# Patient Record
Sex: Female | Born: 1940
Health system: Southern US, Community
[De-identification: ages and names within clinical notes are randomized; demographics above are authoritative.]

## PROBLEM LIST (undated history)

## (undated) DIAGNOSIS — I714 Abdominal aortic aneurysm, without rupture, unspecified: Secondary | ICD-10-CM

## (undated) DIAGNOSIS — R51 Headache: Secondary | ICD-10-CM

## (undated) DIAGNOSIS — R0600 Dyspnea, unspecified: Secondary | ICD-10-CM

## (undated) DIAGNOSIS — J449 Chronic obstructive pulmonary disease, unspecified: Secondary | ICD-10-CM

## (undated) DIAGNOSIS — Z972 Presence of dental prosthetic device (complete) (partial): Secondary | ICD-10-CM

## (undated) DIAGNOSIS — N1832 Chronic kidney disease, stage 3b: Secondary | ICD-10-CM

## (undated) DIAGNOSIS — I10 Essential (primary) hypertension: Secondary | ICD-10-CM

## (undated) DIAGNOSIS — I219 Acute myocardial infarction, unspecified: Secondary | ICD-10-CM

## (undated) DIAGNOSIS — Z95828 Presence of other vascular implants and grafts: Secondary | ICD-10-CM

## (undated) DIAGNOSIS — M1990 Unspecified osteoarthritis, unspecified site: Secondary | ICD-10-CM

## (undated) DIAGNOSIS — K219 Gastro-esophageal reflux disease without esophagitis: Secondary | ICD-10-CM

## (undated) DIAGNOSIS — N183 Chronic kidney disease, stage 3 (moderate): Secondary | ICD-10-CM

## (undated) DIAGNOSIS — R519 Headache, unspecified: Secondary | ICD-10-CM

## (undated) DIAGNOSIS — F419 Anxiety disorder, unspecified: Secondary | ICD-10-CM

## (undated) HISTORY — PX: ABDOMINAL HYSTERECTOMY: SHX81

## (undated) HISTORY — DX: Abdominal aortic aneurysm, without rupture, unspecified: I71.40

## (undated) HISTORY — DX: Abdominal aortic aneurysm, without rupture: I71.4

---

## 2007-01-01 ENCOUNTER — Ambulatory Visit: Payer: Self-pay | Admitting: Internal Medicine

## 2008-02-12 ENCOUNTER — Other Ambulatory Visit: Payer: Self-pay

## 2008-02-12 ENCOUNTER — Inpatient Hospital Stay: Payer: Self-pay | Admitting: Internal Medicine

## 2008-03-31 ENCOUNTER — Ambulatory Visit: Payer: Self-pay | Admitting: Family Medicine

## 2009-03-07 ENCOUNTER — Ambulatory Visit: Payer: Self-pay | Admitting: Surgery

## 2009-03-19 ENCOUNTER — Ambulatory Visit: Payer: Self-pay | Admitting: Internal Medicine

## 2009-05-01 ENCOUNTER — Inpatient Hospital Stay: Payer: Self-pay | Admitting: Internal Medicine

## 2009-05-16 ENCOUNTER — Ambulatory Visit: Payer: Self-pay | Admitting: Surgery

## 2009-05-18 ENCOUNTER — Ambulatory Visit: Payer: Self-pay | Admitting: Surgery

## 2009-08-25 HISTORY — PX: CARDIAC CATHETERIZATION: SHX172

## 2009-11-21 ENCOUNTER — Emergency Department: Payer: Self-pay | Admitting: Emergency Medicine

## 2010-01-23 DIAGNOSIS — Z95828 Presence of other vascular implants and grafts: Secondary | ICD-10-CM

## 2010-01-23 HISTORY — DX: Presence of other vascular implants and grafts: Z95.828

## 2010-01-24 ENCOUNTER — Ambulatory Visit: Payer: Self-pay | Admitting: Family Medicine

## 2010-02-11 ENCOUNTER — Inpatient Hospital Stay: Payer: Self-pay | Admitting: Cardiovascular Disease

## 2010-04-04 ENCOUNTER — Inpatient Hospital Stay: Payer: Self-pay | Admitting: Internal Medicine

## 2010-04-19 ENCOUNTER — Ambulatory Visit: Payer: Self-pay | Admitting: Vascular Surgery

## 2011-05-08 ENCOUNTER — Emergency Department: Payer: Self-pay | Admitting: Emergency Medicine

## 2011-11-03 ENCOUNTER — Ambulatory Visit: Payer: Self-pay | Admitting: Internal Medicine

## 2011-11-03 LAB — CBC WITH DIFFERENTIAL/PLATELET
Basophil #: 0.1 10*3/uL (ref 0.0–0.1)
Basophil %: 0.7 %
Eosinophil #: 0.4 10*3/uL (ref 0.0–0.7)
Eosinophil %: 5.4 %
HCT: 40.3 % (ref 35.0–47.0)
HGB: 13.2 g/dL (ref 12.0–16.0)
Lymphocyte #: 3.2 10*3/uL (ref 1.0–3.6)
Lymphocyte %: 41.1 %
MCH: 31.1 pg (ref 26.0–34.0)
MCHC: 32.8 g/dL (ref 32.0–36.0)
MCV: 95 fL (ref 80–100)
Monocyte #: 0.5 10*3/uL (ref 0.0–0.7)
Monocyte %: 6.8 %
Neutrophil #: 3.6 10*3/uL (ref 1.4–6.5)
Neutrophil %: 46 %
Platelet: 318 10*3/uL (ref 150–440)
RBC: 4.25 10*6/uL (ref 3.80–5.20)
RDW: 14.6 % — ABNORMAL HIGH (ref 11.5–14.5)
WBC: 7.9 10*3/uL (ref 3.6–11.0)

## 2011-11-03 LAB — COMPREHENSIVE METABOLIC PANEL
Albumin: 4.7 g/dL (ref 3.4–5.0)
Alkaline Phosphatase: 102 U/L (ref 50–136)
Anion Gap: 9 (ref 7–16)
BUN: 31 mg/dL — ABNORMAL HIGH (ref 7–18)
Bilirubin,Total: 0.3 mg/dL (ref 0.2–1.0)
Calcium, Total: 9.7 mg/dL (ref 8.5–10.1)
Chloride: 104 mmol/L (ref 98–107)
Co2: 29 mmol/L (ref 21–32)
Creatinine: 1.99 mg/dL — ABNORMAL HIGH (ref 0.60–1.30)
EGFR (African American): 32 — ABNORMAL LOW
EGFR (Non-African Amer.): 26 — ABNORMAL LOW
Glucose: 86 mg/dL (ref 65–99)
Osmolality: 289 (ref 275–301)
Potassium: 5.1 mmol/L (ref 3.5–5.1)
SGOT(AST): 38 U/L — ABNORMAL HIGH (ref 15–37)
SGPT (ALT): 23 U/L
Sodium: 142 mmol/L (ref 136–145)
Total Protein: 9 g/dL — ABNORMAL HIGH (ref 6.4–8.2)

## 2011-11-03 LAB — TSH: Thyroid Stimulating Horm: 1.95 u[IU]/mL

## 2011-11-05 ENCOUNTER — Ambulatory Visit: Payer: Self-pay | Admitting: Internal Medicine

## 2012-07-16 ENCOUNTER — Observation Stay: Payer: Self-pay | Admitting: Student

## 2012-07-16 LAB — COMPREHENSIVE METABOLIC PANEL
Albumin: 2.8 g/dL — ABNORMAL LOW (ref 3.4–5.0)
BUN: 26 mg/dL — ABNORMAL HIGH (ref 7–18)
Bilirubin,Total: 0.3 mg/dL (ref 0.2–1.0)
Calcium, Total: 8.3 mg/dL — ABNORMAL LOW (ref 8.5–10.1)
Co2: 26 mmol/L (ref 21–32)
Creatinine: 1.7 mg/dL — ABNORMAL HIGH (ref 0.60–1.30)
EGFR (Non-African Amer.): 30 — ABNORMAL LOW
Glucose: 88 mg/dL (ref 65–99)
Osmolality: 263 (ref 275–301)
Potassium: 3.8 mmol/L (ref 3.5–5.1)
SGOT(AST): 14 U/L — ABNORMAL LOW (ref 15–37)
Sodium: 129 mmol/L — ABNORMAL LOW (ref 136–145)

## 2012-07-16 LAB — CBC
HCT: 31.8 % — ABNORMAL LOW (ref 35.0–47.0)
HGB: 10.5 g/dL — ABNORMAL LOW (ref 12.0–16.0)
MCH: 30.6 pg (ref 26.0–34.0)
MCHC: 32.9 g/dL (ref 32.0–36.0)
MCV: 93 fL (ref 80–100)
RBC: 3.41 10*6/uL — ABNORMAL LOW (ref 3.80–5.20)

## 2012-07-16 LAB — URINALYSIS, COMPLETE
Ketone: NEGATIVE
Leukocyte Esterase: NEGATIVE
Ph: 6 (ref 4.5–8.0)
Protein: NEGATIVE
RBC,UR: <1 /HPF (ref 0–5)

## 2012-07-16 LAB — CK TOTAL AND CKMB (NOT AT ARMC)
CK, Total: 40 U/L (ref 21–215)
CK-MB: 1.1 ng/mL (ref 0.5–3.6)
CK-MB: 1.8 ng/mL (ref 0.5–3.6)

## 2012-07-16 LAB — TROPONIN I: Troponin-I: <0.02 ng/mL

## 2012-07-16 LAB — PROTIME-INR: Prothrombin Time: 14 secs (ref 11.5–14.7)

## 2012-07-17 LAB — BASIC METABOLIC PANEL
Anion Gap: 6 — ABNORMAL LOW (ref 7–16)
BUN: 20 mg/dL — ABNORMAL HIGH (ref 7–18)
Calcium, Total: 8.5 mg/dL (ref 8.5–10.1)
Glucose: 87 mg/dL (ref 65–99)
Osmolality: 272 (ref 275–301)
Potassium: 3.6 mmol/L (ref 3.5–5.1)
Sodium: 135 mmol/L — ABNORMAL LOW (ref 136–145)

## 2012-07-17 LAB — CK TOTAL AND CKMB (NOT AT ARMC): CK-MB: 1.5 ng/mL (ref 0.5–3.6)

## 2012-07-17 LAB — TROPONIN I: Troponin-I: <0.02 ng/mL

## 2014-09-13 DIAGNOSIS — J449 Chronic obstructive pulmonary disease, unspecified: Secondary | ICD-10-CM | POA: Diagnosis not present

## 2014-10-14 DIAGNOSIS — J449 Chronic obstructive pulmonary disease, unspecified: Secondary | ICD-10-CM | POA: Diagnosis not present

## 2014-11-12 DIAGNOSIS — J449 Chronic obstructive pulmonary disease, unspecified: Secondary | ICD-10-CM | POA: Diagnosis not present

## 2014-11-13 DIAGNOSIS — J441 Chronic obstructive pulmonary disease with (acute) exacerbation: Secondary | ICD-10-CM | POA: Diagnosis not present

## 2014-11-20 DIAGNOSIS — G43119 Migraine with aura, intractable, without status migrainosus: Secondary | ICD-10-CM | POA: Diagnosis not present

## 2014-11-20 DIAGNOSIS — J449 Chronic obstructive pulmonary disease, unspecified: Secondary | ICD-10-CM | POA: Diagnosis not present

## 2014-11-20 DIAGNOSIS — I208 Other forms of angina pectoris: Secondary | ICD-10-CM | POA: Diagnosis not present

## 2014-11-20 DIAGNOSIS — K222 Esophageal obstruction: Secondary | ICD-10-CM | POA: Diagnosis not present

## 2014-12-12 NOTE — H&P (Signed)
PATIENT NAME:  Carolyn Lewis, Carolyn Lewis  DATE OF ADMISSION:  07/16/2012  PRIMARY CARE PHYSICIAN: Carolyn DownsJaved Masoud, MD  CHIEF COMPLAINT: Syncope episode along with collapse.   HISTORY OF PRESENT ILLNESS: Ms. Carolyn Lewis is a pleasant 74 year old Caucasian female with past medical history of chronic obstructive pulmonary disease, hypertension, history of triple A and coronary artery disease who comes to the Emergency Room after she was found to have low blood pressure of 71/51, recorded this morning by husband. The patient then was sitting on the couch and the next thing the husband finds in a few minutes is the patient was found on the floor, collapsed. He called EMS. The patient was still unconscious.  By the time EMS came, the patient was waking up a little bit, got started on IV fluids, and in the Emergency Room she is wide awake, alert, oriented, and her blood pressure during my evaluation is 100/58. The patient is being admitted for syncopal episode secondary to dehydration. Please note, she is on several blood pressure medications and the patient's husband tells me she is having a lot of fluctuations in her blood pressure. The patient denies at this time any chest pain or shortness of breath.   ALLERGIES: Sulfa.   MEDICATIONS:  1. Aspirin 500 mg orally daily as needed.  2. Tribenzor 40/5/12.5 mg p.o. daily.  3. Tizanidine 4 mg three times daily. 4. Plavix 75 mg daily.  5. Protonix 40 mg daily.  6. Nitroglycerin 0.4 mg sublingual every five minutes as needed.  7. Metoprolol 25 mg twice a day. 8. Hyoscyamine 0.375 mg one twice a day. 9. Famotidine 20 mg daily.  10. Diazepam 2 mg twice a day.  11. Combivent inhalation 2 puffs inhaled four times daily.  12. Atorvastatin 40 mg one tablet once a day at bedtime.  13. Amitriptyline 25 mg daily.  14. Amiloride/hydrochlorothiazide 5/50 mg one tablet once a week.   SOCIAL HISTORY: Married, lives with husband. Denies any  history of smoking, is a former smoker, quit smoking about a year ago.   FAMILY HISTORY: Positive for hypertension. Brother with diabetes.   PAST MEDICAL HISTORY:  1. Migraine headaches.  2. Gastroesophageal reflux disease with history of peptic ulcer disease.  3. Breast tumor status post bilateral mastectomy, apparently noncancerous after pathology reports were obtained.  4. History of triple A.  PAST SURGICAL HISTORY:  1. Hysterectomy. 2. Bilateral mastectomy.   REVIEW OF SYSTEMS: CONSTITUTIONAL: Positive for fatigue and weakness. EYES: No blurred or double vision. ENT: No tinnitus, ear pain, or hearing loss. RESPIRATORY: No cough, wheeze, or hemoptysis. CARDIOVASCULAR: Positive for intermittent chest pain with shortness of breath. No palpitations. GASTROINTESTINAL: Positive for gastroesophageal reflux disease. No nausea, vomiting, diarrhea, or abdominal pain. GU: No dysuria or hematuria. ENDOCRINE: No polyuria or nocturia. HEMATOLOGY: No anemia or easy bruising. SKIN: No acne or rash. MUSCULOSKELETAL: Positive for arthritis. NEUROLOGIC: No cerebrovascular accident or transient ischemic attack. PSYCH: No anxiety or depression. All other systems reviewed and negative.   RESULTS: EKG shows normal sinus rhythm with left axis deviation and nonspecific ST-T changes.   Ultrasound of the aorta shows abdominal aortic aneurysm. No aortic distention. Slight increase in size from previous examination and the size currently is 3.7 x 3.34 while mid abdominal aorta measures 3.02 to 3.20.   CT of the head is negative for any acute intracranial process.   Urinalysis is negative for urinary tract infection.   White count is 12.8, hemoglobin  and hematocrit is 10.5 and 31.8, and platelet count 216. Glucose 88, BUN 26, creatinine 1.7, sodium 129, potassium 3.8, and chloride 96. LFTs within normal limits. Albumin 2.8. Cardiac enzymes, first set negative.   ASSESSMENT AND PLAN: 74 year old Ms. Carolyn Lewis with  history of hypertension, gastroesophageal reflux disease, and hypercholesterolemia who comes in with:  1. Syncope with collapse. Etiology appears to be hypotension in the setting of dehydration along with multiple medications which the patient is taking for her blood pressure. We will hold off on all the blood pressure medications at this time, give IV fluids. Blood pressure is coming up, right now it is 100/58. The patient is otherwise hemodynamically stable. We will have to adjust some of the medications. Her EKG does not show any acute changes. Cardiac enzymes are negative. We will check an echo of the heart.  2. Acute on chronic renal failure. Creatinine baseline is around 1.6. We will give IV fluids.  3. Hyponatremia secondary to dehydration and/or from hydrochlorothiazide. I will hold off on hydrochlorothiazide for now.  4. Chronic migraine headaches. We will give trial of Fioricet. The patient says she has tried most of the medications and none of them work. She has been taking aspirin on a daily basis which I have told her to avoid given her history of peptic ulcer disease.  5. History of gastroesophageal reflux disease/peptic ulcer disease. Continue PPIs.  6. Coronary artery disease status post stent placement x2, in 2011, at [facility]. The patient is on Plavix; we will continue that.   Further work-up according to the patient's clinical course. Hospital admission plan was discussed with the patient and the patient's husband who is agreeable to it.   TIME SPENT: 50 minutes.  ____________________________ Wylie Hail Carolyn Katz, MD sap:slb D: 07/16/2012 14:22:27 ET     T: 07/16/2012 14:38:56 ET       JOB#: [id number] cc: Carolyn Penninger A. Carolyn Katz, MD, <Dictator> Carolyn Downs, MD Carolyn Ora MD ELECTRONICALLY SIGNED 07/18/2012 13:40

## 2014-12-12 NOTE — Discharge Summary (Signed)
PATIENT NAME:  Carolyn Lewis, Carolyn Lewis MR#:  [id number] DATE OF BIRTH:  [birth year]  DATE OF ADMISSION:  07/16/2012 DATE OF DISCHARGE:  07/18/2012  CHIEF COMPLAINT: Syncope.   DISCHARGE DIAGNOSES:  1. Syncope, possible orthostatic hypotension.  2. Labile blood pressure, possibly from Zanaflex. 3. Chronic kidney disease stage III to IV.   4. Hypertension.  5. History of migraines.  6. History of abdominal aortic aneurysm.  7. History of breast tumor status post bilateral mastectomy.  8. Gastroesophageal reflux disease with history of peptic ulcer disease.  9. History of bilateral carotid stenosis.  10. Hyponatremia.   DISCHARGE MEDICATIONS:  1. Pantoprazole 40 mg daily.  2. Diazepam 2 mg 2 times a day.   3. Hyoscyamine 0.375 mg 1 tab 2 times a day extended release.  4. Plavix 75 mg daily.  5. Famotidine 20 mg daily.  6. Amitriptyline 25 mg daily.  7. Atorvastatin 40 mg at bedtime.   8. Metoprolol 25 mg two times a day.  9. Combivent 2 puffs four times a day.   10. Nitroglycerin 0.4 mg p.r.n. x3 every five minutes as needed for chest pain.   11. Amlodipine 5 mg daily.  12. Stop taking Tribenzor, aspirin, tizanidine and amiloride/hydrochlorothiazide.   DIET: Low sodium.   ACTIVITY: As tolerated.   FOLLOW UP: Please follow with your primary care physician within 1 to 2 weeks for blood pressure check, with nephrology within 1 to 2 weeks and with Dr. Gilda CreaseSchnier / Dr. Wyn Quakerew, your vascular surgeon, within 1 to 2 weeks.   DISPOSITION: Home.   CODE STATUS: FULL CODE.   HISTORY OF PRESENT ILLNESS: For full details of the history and physical please see the dictation on 07/16/2012 by Dr. Allena KatzPatel, but briefly this is a 74 year old female with history of chronic obstructive pulmonary disease, hypertension, history of abdominal aortic aneurysm and coronary artery disease who came in with low blood pressure, 71/51 recorded by husband and a syncopal episode. Patient did receive some IV fluids en route to the  hospital and was admitted to the hospitalist service for further evaluation and management.   LABORATORY, DIAGNOSTIC AND RADIOLOGICAL DATA: Initial creatinine 1.7 on 11/22, on 11/23 it was 1.68. Sodium on arrival 129, following day it was 135. Chloride on arrival 96, GFR 30, calcium 8.3, albumin 2.8, AST 14, ALT 11. Troponins negative x3. CK-MB within normal limits x3. WBC on arrival 12.8. Urinalysis not suggestive of infection. X-ray of the chest does not show any evidence for pneumonia. CT of head without contrast: No acute intracranial process. Chronic small vessel ischemic disease. Ultrasound aorta: Abdominal aortic aneurysm. No iliac extension, slight increase in size since the previous exam at 4.24 cm anterior/posterior x 4.34 cm transverse. Ultrasound of carotid bilateral showing there is greater than 50% in the distal right internal carotid. The peak systolic velocity in this region is 3 to 3.3 m/second with common carotid peak systolic velocity of 100.5 cm/sec yielding a ratio of 3.53. Peak systolic velocity in the left are normal.    HOSPITAL COURSE: Patient was admitted to the hospitalist service on telemetry and placed on remote telemetry. She had cardiac markers x3 which were negative which ruled out acute coronary syndrome. Patient, of note, has had recent echocardiogram which was not repeated. CT of the head was negative and patient did normalize in terms of her mentation symptoms. Patient, of note, did have blood pressure decreased with standing signifying an element of orthostasis, however, has had some IV fluids in the EMS. Of  note, patient has been having Zanaflex use for blood pressure management; when the blood pressure is high the husband gives her Zanaflex and it could be possible Zanaflex is contributing to her severe hypotension on arrival. Patient did have a bout of hypotension after Zanaflex was administered here. Besides the Zanaflex that morning she also received metoprolol 25 mg  dose which I think is highly doubtful to have caused a drop in blood pressure that much. Furthermore, the husband uses other blood pressurs agents  p.r.n. when systolic blood pressures are high as well as Zanaflex. The episodic use of these medications were strongly discouraged. I have started the patient on amlodipine which in addition to metoprolol have stabilized the blood pressure without significant variations and she is to continue with primary care physician for blood pressure management. Of note, the carotid ultrasound was done the result of which is above. Patient states that she follows with Dr. Gilda Crease for follow up and they do ultrasounds there and states that on one side it is 50 and on the other side is 60% stenosis. I have discussed the result of this ultrasound done here with Dr. Wyn Quaker who states that this is a borderline ultrasound and there is no intervention necessary during this acute hospital stay and she can follow with their office within a week or so for further follow up. Patient did have some hyponatremia, possibly secondary to diuretics which responded to IV fluids. Hyponatremia has resolved. She appears to have chronic kidney disease stage III to IV. Her last GFR is in the 30 range and she does not follow with a nephrologist. Dr. Wynelle Link from nephrology has given them a card and they can follow with his office as an outpatient. At this point she will be discharged with outpatient follow up and blood pressure check and monitoring and further follow up as dictated above.   TOTAL TIME SPENT: 35 minutes.   ____________________________ Krystal Eaton, MD sa:cms D: 07/18/2012 12:16:18 ET T: 07/19/2012 11:02:53 ET JOB#: [id number]  cc: Krystal Eaton, MD, <Dictator> Corky Downs, MD Renford Dills, MD  Krystal Eaton MD ELECTRONICALLY SIGNED 08/05/2012 11:07

## 2014-12-13 DIAGNOSIS — J449 Chronic obstructive pulmonary disease, unspecified: Secondary | ICD-10-CM | POA: Diagnosis not present

## 2015-01-12 DIAGNOSIS — J449 Chronic obstructive pulmonary disease, unspecified: Secondary | ICD-10-CM | POA: Diagnosis not present

## 2015-01-19 DIAGNOSIS — I208 Other forms of angina pectoris: Secondary | ICD-10-CM | POA: Diagnosis not present

## 2015-01-19 DIAGNOSIS — I739 Peripheral vascular disease, unspecified: Secondary | ICD-10-CM | POA: Diagnosis not present

## 2015-01-19 DIAGNOSIS — J449 Chronic obstructive pulmonary disease, unspecified: Secondary | ICD-10-CM | POA: Diagnosis not present

## 2015-01-19 DIAGNOSIS — G43119 Migraine with aura, intractable, without status migrainosus: Secondary | ICD-10-CM | POA: Diagnosis not present

## 2015-01-31 ENCOUNTER — Emergency Department: Payer: Medicare Other

## 2015-01-31 ENCOUNTER — Encounter: Payer: Self-pay | Admitting: Emergency Medicine

## 2015-01-31 ENCOUNTER — Inpatient Hospital Stay
Admission: EM | Admit: 2015-01-31 | Discharge: 2015-02-03 | DRG: 872 | Disposition: A | Discharge status: Home Health | Payer: Medicare Other | Attending: Internal Medicine | Admitting: Internal Medicine

## 2015-01-31 DIAGNOSIS — J449 Chronic obstructive pulmonary disease, unspecified: Secondary | ICD-10-CM | POA: Diagnosis present

## 2015-01-31 DIAGNOSIS — R000 Tachycardia, unspecified: Secondary | ICD-10-CM | POA: Diagnosis not present

## 2015-01-31 DIAGNOSIS — R509 Fever, unspecified: Secondary | ICD-10-CM | POA: Diagnosis not present

## 2015-01-31 DIAGNOSIS — I129 Hypertensive chronic kidney disease with stage 1 through stage 4 chronic kidney disease, or unspecified chronic kidney disease: Secondary | ICD-10-CM | POA: Diagnosis present

## 2015-01-31 DIAGNOSIS — J9610 Chronic respiratory failure, unspecified whether with hypoxia or hypercapnia: Secondary | ICD-10-CM | POA: Diagnosis present

## 2015-01-31 DIAGNOSIS — N390 Urinary tract infection, site not specified: Secondary | ICD-10-CM | POA: Diagnosis present

## 2015-01-31 DIAGNOSIS — R1030 Lower abdominal pain, unspecified: Secondary | ICD-10-CM | POA: Diagnosis not present

## 2015-01-31 DIAGNOSIS — B9620 Unspecified Escherichia coli [E. coli] as the cause of diseases classified elsewhere: Secondary | ICD-10-CM | POA: Diagnosis not present

## 2015-01-31 DIAGNOSIS — N183 Chronic kidney disease, stage 3 (moderate): Secondary | ICD-10-CM | POA: Diagnosis present

## 2015-01-31 DIAGNOSIS — Z7902 Long term (current) use of antithrombotics/antiplatelets: Secondary | ICD-10-CM

## 2015-01-31 DIAGNOSIS — Z825 Family history of asthma and other chronic lower respiratory diseases: Secondary | ICD-10-CM

## 2015-01-31 DIAGNOSIS — R0602 Shortness of breath: Secondary | ICD-10-CM

## 2015-01-31 DIAGNOSIS — R1084 Generalized abdominal pain: Secondary | ICD-10-CM | POA: Diagnosis not present

## 2015-01-31 DIAGNOSIS — E871 Hypo-osmolality and hyponatremia: Secondary | ICD-10-CM | POA: Diagnosis present

## 2015-01-31 DIAGNOSIS — I252 Old myocardial infarction: Secondary | ICD-10-CM | POA: Diagnosis not present

## 2015-01-31 DIAGNOSIS — Z9981 Dependence on supplemental oxygen: Secondary | ICD-10-CM | POA: Diagnosis not present

## 2015-01-31 DIAGNOSIS — K219 Gastro-esophageal reflux disease without esophagitis: Secondary | ICD-10-CM | POA: Diagnosis present

## 2015-01-31 DIAGNOSIS — Z79899 Other long term (current) drug therapy: Secondary | ICD-10-CM

## 2015-01-31 DIAGNOSIS — R109 Unspecified abdominal pain: Secondary | ICD-10-CM | POA: Diagnosis present

## 2015-01-31 DIAGNOSIS — F419 Anxiety disorder, unspecified: Secondary | ICD-10-CM | POA: Diagnosis not present

## 2015-01-31 DIAGNOSIS — R1110 Vomiting, unspecified: Secondary | ICD-10-CM | POA: Diagnosis not present

## 2015-01-31 DIAGNOSIS — N3000 Acute cystitis without hematuria: Secondary | ICD-10-CM | POA: Diagnosis not present

## 2015-01-31 DIAGNOSIS — I2510 Atherosclerotic heart disease of native coronary artery without angina pectoris: Secondary | ICD-10-CM | POA: Diagnosis present

## 2015-01-31 DIAGNOSIS — I509 Heart failure, unspecified: Secondary | ICD-10-CM

## 2015-01-31 DIAGNOSIS — A419 Sepsis, unspecified organism: Secondary | ICD-10-CM | POA: Diagnosis not present

## 2015-01-31 DIAGNOSIS — A4151 Sepsis due to Escherichia coli [E. coli]: Principal | ICD-10-CM | POA: Diagnosis present

## 2015-01-31 DIAGNOSIS — N179 Acute kidney failure, unspecified: Secondary | ICD-10-CM | POA: Diagnosis not present

## 2015-01-31 DIAGNOSIS — R05 Cough: Secondary | ICD-10-CM | POA: Diagnosis not present

## 2015-01-31 DIAGNOSIS — R531 Weakness: Secondary | ICD-10-CM | POA: Diagnosis not present

## 2015-01-31 DIAGNOSIS — R251 Tremor, unspecified: Secondary | ICD-10-CM | POA: Diagnosis not present

## 2015-01-31 HISTORY — DX: Chronic obstructive pulmonary disease, unspecified: J44.9

## 2015-01-31 HISTORY — DX: Gastro-esophageal reflux disease without esophagitis: K21.9

## 2015-01-31 HISTORY — DX: Chronic kidney disease, stage 3 (moderate): N18.3

## 2015-01-31 HISTORY — DX: Chronic kidney disease, stage 3b: N18.32

## 2015-01-31 HISTORY — DX: Essential (primary) hypertension: I10

## 2015-01-31 HISTORY — DX: Acute myocardial infarction, unspecified: I21.9

## 2015-01-31 HISTORY — DX: Anxiety disorder, unspecified: F41.9

## 2015-01-31 LAB — TROPONIN I: TROPONIN I: 0.05 ng/mL — AB (ref <0.031)

## 2015-01-31 LAB — URINALYSIS COMPLETE WITH MICROSCOPIC (ARMC ONLY)
Bilirubin Urine: NEGATIVE
Glucose, UA: NEGATIVE mg/dL
Ketones, ur: NEGATIVE mg/dL
Nitrite: NEGATIVE
PROTEIN: 30 mg/dL — AB
Specific Gravity, Urine: 1.009 (ref 1.005–1.030)
Squamous Epithelial / LPF: NONE SEEN
pH: 5 (ref 5.0–8.0)

## 2015-01-31 LAB — CBC WITH DIFFERENTIAL/PLATELET
Basophils Absolute: 0 10*3/uL (ref 0–0.1)
Basophils Relative: 1 %
EOS ABS: 0.1 10*3/uL (ref 0–0.7)
Eosinophils Relative: 1 %
HEMATOCRIT: 32.4 % — AB (ref 35.0–47.0)
Hemoglobin: 10.5 g/dL — ABNORMAL LOW (ref 12.0–16.0)
LYMPHS PCT: 5 %
Lymphs Abs: 0.4 10*3/uL — ABNORMAL LOW (ref 1.0–3.6)
MCH: 28.9 pg (ref 26.0–34.0)
MCHC: 32.5 g/dL (ref 32.0–36.0)
MCV: 89 fL (ref 80.0–100.0)
Monocytes Absolute: 0 10*3/uL — ABNORMAL LOW (ref 0.2–0.9)
Monocytes Relative: 1 %
NEUTROS PCT: 92 %
Neutro Abs: 6.5 10*3/uL (ref 1.4–6.5)
PLATELETS: 281 10*3/uL (ref 150–440)
RBC: 3.64 MIL/uL — ABNORMAL LOW (ref 3.80–5.20)
RDW: 16.1 % — ABNORMAL HIGH (ref 11.5–14.5)
WBC: 7 10*3/uL (ref 3.6–11.0)

## 2015-01-31 LAB — COMPREHENSIVE METABOLIC PANEL
ALK PHOS: 172 U/L — AB (ref 38–126)
ALT: 11 U/L — AB (ref 14–54)
AST: 31 U/L (ref 15–41)
Albumin: 3.5 g/dL (ref 3.5–5.0)
Anion gap: 13 (ref 5–15)
BUN: 29 mg/dL — ABNORMAL HIGH (ref 6–20)
CALCIUM: 8.9 mg/dL (ref 8.9–10.3)
CO2: 20 mmol/L — AB (ref 22–32)
Chloride: 98 mmol/L — ABNORMAL LOW (ref 101–111)
Creatinine, Ser: 2.26 mg/dL — ABNORMAL HIGH (ref 0.44–1.00)
GFR calc Af Amer: 24 mL/min — ABNORMAL LOW (ref >60)
GFR calc non Af Amer: 20 mL/min — ABNORMAL LOW (ref >60)
Glucose, Bld: 116 mg/dL — ABNORMAL HIGH (ref 65–99)
POTASSIUM: 3.9 mmol/L (ref 3.5–5.1)
Sodium: 131 mmol/L — ABNORMAL LOW (ref 135–145)
Total Bilirubin: 0.7 mg/dL (ref 0.3–1.2)
Total Protein: 7.4 g/dL (ref 6.5–8.1)

## 2015-01-31 LAB — LACTIC ACID, PLASMA: Lactic Acid, Venous: 3.2 mmol/L (ref 0.5–2.0)

## 2015-01-31 LAB — LIPASE, BLOOD: Lipase: 46 U/L (ref 22–51)

## 2015-01-31 MED ORDER — VANCOMYCIN HCL IN DEXTROSE 1-5 GM/200ML-% IV SOLN
INTRAVENOUS | Status: AC
  Filled 2015-01-31: qty 200

## 2015-01-31 MED ORDER — CLOPIDOGREL BISULFATE 75 MG PO TABS
75.0000 mg | ORAL_TABLET | Freq: Every day | ORAL | Status: DC
  Administered 2015-01-31 – 2015-02-03 (×4): 75 mg via ORAL
  Filled 2015-01-31 (×4): qty 1

## 2015-01-31 MED ORDER — PIPERACILLIN-TAZOBACTAM 3.375 G IVPB
INTRAVENOUS | Status: AC
  Filled 2015-01-31: qty 50

## 2015-01-31 MED ORDER — ACETAMINOPHEN 325 MG PO TABS: 650.0000 mg | ORAL_TABLET | Freq: Four times a day (QID) | ORAL | Status: DC | PRN

## 2015-01-31 MED ORDER — ACETAMINOPHEN 650 MG RE SUPP: 650.0000 mg | Freq: Four times a day (QID) | RECTAL | Status: DC | PRN

## 2015-01-31 MED ORDER — DILTIAZEM HCL 25 MG/5ML IV SOLN
10.0000 mg | Freq: Once | INTRAVENOUS | Status: AC
  Administered 2015-01-31: 10 mg via INTRAVENOUS
  Filled 2015-01-31: qty 5

## 2015-01-31 MED ORDER — PIPERACILLIN-TAZOBACTAM 3.375 G IVPB: 3.3750 g | Freq: Three times a day (TID) | INTRAVENOUS | Status: DC

## 2015-01-31 MED ORDER — ONDANSETRON HCL 4 MG PO TABS
4.0000 mg | ORAL_TABLET | Freq: Four times a day (QID) | ORAL | Status: DC | PRN
Start: 2015-01-31 — End: 2015-02-03

## 2015-01-31 MED ORDER — ACETAMINOPHEN 500 MG PO TABS
ORAL_TABLET | ORAL | Status: AC
  Administered 2015-01-31: 500 mg via ORAL
  Filled 2015-01-31: qty 1

## 2015-01-31 MED ORDER — IPRATROPIUM-ALBUTEROL 0.5-2.5 (3) MG/3ML IN SOLN
3.0000 mL | Freq: Once | RESPIRATORY_TRACT | Status: AC
  Administered 2015-01-31: 3 mL via RESPIRATORY_TRACT

## 2015-01-31 MED ORDER — NITROGLYCERIN 0.4 MG SL SUBL: 0.4000 mg | SUBLINGUAL_TABLET | SUBLINGUAL | Status: DC | PRN

## 2015-01-31 MED ORDER — IPRATROPIUM-ALBUTEROL 0.5-2.5 (3) MG/3ML IN SOLN
RESPIRATORY_TRACT | Status: AC
  Administered 2015-01-31: 3 mL via RESPIRATORY_TRACT
  Filled 2015-01-31: qty 3

## 2015-01-31 MED ORDER — ALUM & MAG HYDROXIDE-SIMETH 200-200-20 MG/5ML PO SUSP: 30.0000 mL | Freq: Four times a day (QID) | ORAL | Status: DC | PRN

## 2015-01-31 MED ORDER — PIPERACILLIN-TAZOBACTAM 3.375 G IVPB 30 MIN
3.3750 g | Freq: Once | INTRAVENOUS | Status: AC
  Administered 2015-01-31: 3.375 g via INTRAVENOUS

## 2015-01-31 MED ORDER — ACETAMINOPHEN 500 MG PO TABS
500.0000 mg | ORAL_TABLET | Freq: Once | ORAL | Status: AC
  Administered 2015-01-31: 500 mg via ORAL

## 2015-01-31 MED ORDER — IPRATROPIUM-ALBUTEROL 0.5-2.5 (3) MG/3ML IN SOLN
3.0000 mL | Freq: Three times a day (TID) | RESPIRATORY_TRACT | Status: DC | PRN
  Administered 2015-02-01: 3 mL via RESPIRATORY_TRACT
  Filled 2015-01-31: qty 3

## 2015-01-31 MED ORDER — VANCOMYCIN HCL IN DEXTROSE 1-5 GM/200ML-% IV SOLN
1000.0000 mg | Freq: Once | INTRAVENOUS | Status: AC
  Administered 2015-01-31: 1000 mg via INTRAVENOUS

## 2015-01-31 MED ORDER — SODIUM CHLORIDE 0.9 % IJ SOLN
3.0000 mL | Freq: Two times a day (BID) | INTRAMUSCULAR | Status: DC
  Administered 2015-01-31 – 2015-02-02 (×4): 3 mL via INTRAVENOUS

## 2015-01-31 MED ORDER — SODIUM CHLORIDE 0.9 % IV BOLUS (SEPSIS)
30.0000 mL/kg | Freq: Once | INTRAVENOUS | Status: AC
  Administered 2015-01-31: 2001 mL via INTRAVENOUS

## 2015-01-31 MED ORDER — AMITRIPTYLINE HCL 25 MG PO TABS
25.0000 mg | ORAL_TABLET | Freq: Every day | ORAL | Status: DC
  Administered 2015-01-31 – 2015-02-02 (×3): 25 mg via ORAL
  Filled 2015-01-31 (×4): qty 1

## 2015-01-31 MED ORDER — DIAZEPAM 2 MG PO TABS
2.0000 mg | ORAL_TABLET | Freq: Once | ORAL | Status: AC
  Administered 2015-01-31: 2 mg via ORAL

## 2015-01-31 MED ORDER — PANTOPRAZOLE SODIUM 40 MG PO TBEC
40.0000 mg | DELAYED_RELEASE_TABLET | Freq: Every day | ORAL | Status: DC
  Administered 2015-02-01 – 2015-02-03 (×3): 40 mg via ORAL
  Filled 2015-01-31 (×3): qty 1

## 2015-01-31 MED ORDER — CEFTRIAXONE SODIUM IN DEXTROSE 20 MG/ML IV SOLN
1.0000 g | INTRAVENOUS | Status: DC
  Administered 2015-01-31: 1 g via INTRAVENOUS
  Filled 2015-01-31 (×4): qty 50

## 2015-01-31 MED ORDER — SENNOSIDES-DOCUSATE SODIUM 8.6-50 MG PO TABS: 1.0000 | ORAL_TABLET | Freq: Every evening | ORAL | Status: DC | PRN

## 2015-01-31 MED ORDER — ONDANSETRON HCL 4 MG/2ML IJ SOLN: 4.0000 mg | Freq: Four times a day (QID) | INTRAMUSCULAR | Status: DC | PRN

## 2015-01-31 MED ORDER — ALBUTEROL SULFATE (2.5 MG/3ML) 0.083% IN NEBU
2.5000 mg | INHALATION_SOLUTION | Freq: Four times a day (QID) | RESPIRATORY_TRACT | Status: DC
  Administered 2015-02-01 – 2015-02-02 (×5): 2.5 mg via RESPIRATORY_TRACT
  Filled 2015-01-31: qty 3
  Filled 2015-01-31: qty 0.5
  Filled 2015-01-31 (×4): qty 3

## 2015-01-31 MED ORDER — FAMOTIDINE 20 MG PO TABS
20.0000 mg | ORAL_TABLET | Freq: Every day | ORAL | Status: DC
  Administered 2015-02-01 – 2015-02-03 (×3): 20 mg via ORAL
  Filled 2015-01-31 (×3): qty 1

## 2015-01-31 MED ORDER — DIAZEPAM 2 MG PO TABS
ORAL_TABLET | ORAL | Status: AC
  Filled 2015-01-31: qty 1

## 2015-01-31 MED ORDER — METOPROLOL TARTRATE 25 MG PO TABS
25.0000 mg | ORAL_TABLET | Freq: Two times a day (BID) | ORAL | Status: DC
  Administered 2015-01-31 – 2015-02-01 (×4): 25 mg via ORAL
  Filled 2015-01-31 (×4): qty 1

## 2015-01-31 MED ORDER — ATORVASTATIN CALCIUM 20 MG PO TABS
40.0000 mg | ORAL_TABLET | Freq: Every day | ORAL | Status: DC
  Administered 2015-01-31 – 2015-02-03 (×4): 40 mg via ORAL
  Filled 2015-01-31 (×4): qty 2

## 2015-01-31 MED ORDER — VANCOMYCIN HCL IN DEXTROSE 1-5 GM/200ML-% IV SOLN: 1000.0000 mg | INTRAVENOUS | Status: DC

## 2015-01-31 MED ORDER — AMLODIPINE BESYLATE 5 MG PO TABS
5.0000 mg | ORAL_TABLET | Freq: Every day | ORAL | Status: DC
  Administered 2015-02-01: 5 mg via ORAL
  Filled 2015-01-31: qty 1

## 2015-01-31 MED ORDER — DIAZEPAM 2 MG PO TABS
2.0000 mg | ORAL_TABLET | Freq: Two times a day (BID) | ORAL | Status: DC
  Administered 2015-01-31 – 2015-02-03 (×7): 2 mg via ORAL
  Filled 2015-01-31 (×7): qty 1

## 2015-01-31 MED ORDER — HYDROCODONE-ACETAMINOPHEN 5-325 MG PO TABS
1.0000 | ORAL_TABLET | ORAL | Status: DC | PRN
  Administered 2015-01-31 – 2015-02-01 (×4): 2 via ORAL
  Administered 2015-02-01: 1 via ORAL
  Administered 2015-02-02 – 2015-02-03 (×4): 2 via ORAL
  Filled 2015-01-31 (×2): qty 2
  Filled 2015-01-31: qty 1
  Filled 2015-01-31 (×6): qty 2

## 2015-01-31 MED ORDER — SODIUM CHLORIDE 0.9 % IV SOLN
INTRAVENOUS | Status: DC
  Administered 2015-01-31 – 2015-02-01 (×2): via INTRAVENOUS

## 2015-01-31 MED ORDER — HEPARIN SODIUM (PORCINE) 5000 UNIT/ML IJ SOLN
5000.0000 [IU] | Freq: Three times a day (TID) | INTRAMUSCULAR | Status: DC
  Administered 2015-01-31 – 2015-02-03 (×9): 5000 [IU] via SUBCUTANEOUS
  Filled 2015-01-31 (×9): qty 1

## 2015-01-31 NOTE — Progress Notes (Signed)
ANTIBIOTIC CONSULT NOTE - INITIAL  Pharmacy Consult for Vancomycin and Zosyn  Indication: rule out sepsis  Allergies  Allergen Reactions  . Sulfa Antibiotics Rash    Patient Measurements: Height: 5\' 7"  (170.2 cm) Weight: 147 lb (66.679 kg) IBW/kg (Calculated) : 61.6 Adjusted Body Weight: 63 kg  Vital Signs: Temp: 102.7 F (39.3 C) (06/08 0933) Temp Source: Rectal (06/08 0933) BP: 154/68 mmHg (06/08 1106) Pulse Rate: 130 (06/08 1106) Intake/Output from previous day:   Intake/Output from this shift:    Labs:  Recent Labs  01/31/15 0949  WBC 7.0  HGB 10.5*  PLT 281  CREATININE 2.26*   Estimated Creatinine Clearance: 21.9 mL/min (by C-G formula based on Cr of 2.26). No results for input(s): VANCOTROUGH, VANCOPEAK, VANCORANDOM, GENTTROUGH, GENTPEAK, GENTRANDOM, TOBRATROUGH, TOBRAPEAK, TOBRARND, AMIKACINPEAK, AMIKACINTROU, AMIKACIN in the last 72 hours.   Microbiology: No results found for this or any previous visit (from the past 720 hour(s)).  Medical History: Past Medical History  Diagnosis Date  . COPD (chronic obstructive pulmonary disease)   . COPD (chronic obstructive pulmonary disease)   . Acute MI   . Anxiety   . Hypertension   . GERD (gastroesophageal reflux disease)     Medications:  Scheduled:   Assessment: Patient admitted to ED and started on Zosyn and Vancomycin for empirical coverage  Kel (hr-1): 0.023 Half-life (hrs): 30.14 Vd (liters): 44.10 (factor used: 0.7 L/kg)  Goal of Therapy:  Vancomycin trough level 15-20 mcg/ml  Plan:   Patient received Vancomycin 1 g IV x 1 @ ~09:45 and Zosyn 3.375 g IV x 1 @ ~09:45. Will start VAncomycin 1 g IV q36 hours @ 22:00 on 6/9. Will order Vancomycin trough level prior to the 22:00 dose on 6/12. Will also initiate Zosyn 3.375 g q8 hours.   Follow up culture results  Demetrius Charityeldrin D. Myquan Schaumburg, PharmD   01/31/2015,11:34 AM

## 2015-01-31 NOTE — ED Notes (Signed)
Critcal lab value reported to EldonGoodman, md

## 2015-01-31 NOTE — H&P (Signed)
[facility] [place] NAME: Carolyn Lewis    MR#:  [id number]  DATE OF BIRTH:  [birth year]  DATE OF ADMISSION:  01/31/2015  PRIMARY CARE PHYSICIAN: Cletis Athens, MD   REQUESTING/REFERRING PHYSICIAN: Dr. Darcella Gasman   CHIEF COMPLAINT:   Fever and chills HISTORY OF PRESENT ILLNESS:  Carolyn Lewis  is a 74 y.o. female with a known history of COPD with 2 L of home oxygen, CAD and chronic kidney disease stage III who presents with above complaint. Patient states for the past 4 days she's had fevers and chills. She also admits to dysuria, urgency and frequency. She denies any back pain. She also says that she has been weak over the past few days as well. At baseline she has dyspnea and shortness of breath. She also has some mild lower extremity edema which is not new. She has decreased appetite for the past few days. The emergency department she was found to have sepsis as identified by tachycardia and fever. She also has urinary tract infection. She was initially started on broad-spectrum antiemetics including Zosyn and vancomycin for sepsis.  PAST MEDICAL HISTORY:   Past Medical History  Diagnosis Date  . COPD (chronic obstructive pulmonary disease)   . COPD (chronic obstructive pulmonary disease)   . Acute MI   . Anxiety   . Hypertension   . GERD (gastroesophageal reflux disease)    chronic oxygen use of 2  Chronic kidney disease stage III  PAST SURGICAL HISTORY:   Past Surgical History  Procedure Laterality Date  . Abdominal hysterectomy    . Cardiac catheterization  2011   cholecystectomy  SOCIAL HISTORY:   History  Substance Use Topics  . Smoking status: Never Smoker   . Smokeless tobacco: Not on file  . Alcohol Use: No    FAMILY HISTORY:  Positive history for emphysema and stomach cancer  DRUG ALLERGIES:   Allergies  Allergen Reactions  . Sulfa Antibiotics Rash     REVIEW OF SYSTEMS:  CONSTITUTIONAL: Positive fever,  fatigue and weakness. Positive headache EYES: No blurred or double vision.  EARS, NOSE, AND THROAT: No tinnitus or ear pain.  RESPIRATORY: No cough, shortness of breath, wheezing or hemoptysis.  CARDIOVASCULAR: No chest pain, orthopnea, she has chronic minimal edema GASTROINTESTINAL: No nausea, vomiting, diarrhea or abdominal pain.  GENITOURINARY: Positive for dysuria, urgency and frequency ENDOCRINE: No polyuria, nocturia,  HEMATOLOGY: No anemia, easy bruising or bleeding SKIN: No rash or lesion. MUSCULOSKELETAL: She has arthritis  NEUROLOGIC: No tingling, numbness, weakness.  PSYCHIATRY: She has anxiety   MEDICATIONS AT HOME:   Prior to Admission medications   Medication Sig Start Date End Date Taking? Authorizing Provider  amitriptyline (ELAVIL) 25 MG tablet Take 25 mg by mouth at bedtime.   Yes Historical Provider, MD  amLODipine (NORVASC) 5 MG tablet Take 5 mg by mouth daily.   Yes Historical Provider, MD  atorvastatin (LIPITOR) 40 MG tablet Take 40 mg by mouth daily.   Yes Historical Provider, MD  clopidogrel (PLAVIX) 75 MG tablet Take 75 mg by mouth daily.   Yes Historical Provider, MD  diazepam (VALIUM) 2 MG tablet Take 2 mg by mouth 2 (two) times daily.   Yes Historical Provider, MD  enalapril (VASOTEC) 2.5 MG tablet Take 2.5 mg by mouth daily.   Yes Historical Provider, MD  famotidine (PEPCID) 20 MG tablet Take 20 mg by mouth daily.   Yes Historical Provider, MD  furosemide (LASIX) 20 MG tablet  Take 20 mg by mouth once. Patient only takes Monday, Wednesday, and Friday.   Yes Historical Provider, MD  Ipratropium-Albuterol (COMBIVENT RESPIMAT) 20-100 MCG/ACT AERS respimat Inhale 1 puff into the lungs 3 (three) times daily as needed for wheezing or shortness of breath.   Yes Historical Provider, MD  levalbuterol (XOPENEX) 1.25 MG/3ML nebulizer solution Take 1.25 mg by nebulization every 4 (four) hours as needed for wheezing or shortness of breath.   Yes Historical Provider, MD   metoprolol tartrate (LOPRESSOR) 25 MG tablet Take 25 mg by mouth 2 (two) times daily.   Yes Historical Provider, MD  nitroGLYCERIN (NITROSTAT) 0.4 MG SL tablet Place 0.4 mg under the tongue every 5 (five) minutes as needed for chest pain.   Yes Historical Provider, MD  pantoprazole (PROTONIX) 40 MG tablet Take 40 mg by mouth daily.   Yes Historical Provider, MD  traMADol (ULTRAM) 50 MG tablet Take 50 mg by mouth 2 (two) times daily.   Yes Historical Provider, MD      VITAL SIGNS:  Blood pressure 128/66, pulse 129, temperature 98.4 F (36.9 C), temperature source Oral, resp. rate 29, height '5\' 7"'  (1.702 m), weight 66.679 kg (147 lb), SpO2 95 %.  PHYSICAL EXAMINATION:  GENERAL:  74 y.o.-year-old patient lying in the bed with no acute distress. She has nasal cannula in place Eyes : Pupils equal, round, reactive to light and accommodation. No scleral icterus. Extraocular muscles intact.  HEENT: Head atraumatic, normocephalic. Oropharynx and nasopharynx clear.  NECK:  Supple, no jugular venous distention. No thyroid enlargement, no tenderness.  LUNGS: Normal breath sounds bilaterally, no wheezing, rales,rhonchi or crepitation. No use of accessory muscles of respiration.  CARDIOVASCULAR: S1, S2 normal. No murmurs, rubs, or gallops.  ABDOMEN: Soft, nontender, nondistended. Bowel sounds present. No organomegaly or mass.  EXTREMITIES: No pedal edema, cyanosis, or clubbing.  NEURO: Cranial nerves II through XII are intact. Muscle strength 4/5 in all extremities. Sensation intact. Gait not checked.  PSYCHIATRIC: The patient is alert and oriented x 3.  SKIN: No obvious rash, lesion, or ulcer.  BACK: No CVA tenderness  LABORATORY PANEL:   CBC  Recent Labs Lab 01/31/15 0949  WBC 7.0  HGB 10.5*  HCT 32.4*  PLT 281   ------------------------------------------------------------------------------------------------------------------  Chemistries   Recent Labs Lab 01/31/15 0949  NA 131*  K  3.9  CL 98*  CO2 20*  GLUCOSE 116*  BUN 29*  CREATININE 2.26*  CALCIUM 8.9  AST 31  ALT 11*  ALKPHOS 172*  BILITOT 0.7   ------------------------------------------------------------------------------------------------------------------  Cardiac Enzymes  Recent Labs Lab 01/31/15 0949  TROPONINI 0.05*   ------------------------------------------------------------------------------------------------------------------  RADIOLOGY:  Dg Abd Acute W/chest  01/31/2015   CLINICAL DATA:  GIMPRESSION: COPD.  No active cardiopulmonary disease.  Prior cholecystectomy.  No evidence of bowel obstruction or free air.   Electronically Signed   By: Rolm Baptise M.D.   On: 01/31/2015 10:20    EKG:  Normal sinus rhythm and no ST elevation or depression.  IMPRESSION AND PLAN:  This is a very pleasant 74 year old female with a history of CAD, COPD with chronic oxygen of 2 L and chronic kidney disease stage III who presents with fever and chills along with dysuria, urgency and frequency.  1. sepsis: Patient presents with sepsis. Criteria is met by tachycardia and fever. Dr. Bennie Pierini is 102 here in the emergency department. Patient's sepsis is secondary to urinary tract infection as evidenced by her symptoms as well as a urine analysis. Patient will  be placed on Rocephin and IV fluids. Urine culture and blood cultures were ordered in the emergency department which we will need to follow-up on.  2. Urinary tract infection: As mentioned above patient is on Rocephin. These follow up on urine culture.  3. Chronic respiratory failure with 2 L of oxygen secondary to COPD: This seems to be stable at this time. We will continue with oxygen along with inhalers.  4. CAD: Patient does have stent placed. Patient will continue outpatient medications including Plavix, atorvastatin, metoprolol, nitroglycerin when necessary and Norvasc.  5. Acute on chronic renal failure: Patient's baseline creatinine is approximately  1.6-2.0. I am holding her nephrotoxic agents including enalapril, tramadol and Lasix. I will provide IV fluids and repeat it BMP in a.m.  6. Hyponatremia: This may be due to underlying sepsis and poor by mouth intake over the past few days. I will continue IV fluids and repeat a BMP in a.m.    l  have reviewed  records  and discussed patient  with ED provider. Management plans discussed with the patient  and her family and they are in agreement.  CODE STATUS: FULL  TOTAL TIME TAKING CARE OF THIS PATIENT: 55 minutes.    Carolyn Lewis M.D on 01/31/2015 at 12:58 PM  Between 7am to 6pm - Pager - [phone] After 6pm go to [email or web address] - [email or web address] [facility]  Office  [phone]  CC: Primary care physician; Cletis Athens, MD

## 2015-01-31 NOTE — ED Notes (Signed)
Pt stable upon time of transport. Pt able to carry on full conversations without pauses to catch breath. Pt able to ambulate to bathroom in ED with one assist. Pt in no acute distress at this time and verbalizes no pain at this time.

## 2015-01-31 NOTE — ED Provider Notes (Signed)
[facility] Emergency Department Provider Note    ____________________________________________  Time seen: On EMS arrival  I have reviewed the triage vital signs and the nursing notes.   HISTORY  Chief Complaint Weakness   History limited by: Not Limited   HPI Carolyn Lewis is a 74 y.o. female who presents to the emergency department via EMS today because of weakness, cough, fever abdominal pain and vomiting. Symptoms have been going on for the past couple of days.The patient states that she has had chills. Has had burning with urination. Is on home O2 of 2 L chronically.     Past Medical History  Diagnosis Date  . COPD (chronic obstructive pulmonary disease)   . COPD (chronic obstructive pulmonary disease)   . Acute MI   . Anxiety   . Hypertension   . GERD (gastroesophageal reflux disease)   . CKD stage G3b/A3, GFR 30 - 44 and albumin creatinine ratio >300 mg/g     Patient Active Problem List   Diagnosis Date Noted  . Sepsis 01/31/2015    Past Surgical History  Procedure Laterality Date  . Abdominal hysterectomy    . Cardiac catheterization  2011    No current outpatient prescriptions on file.  Allergies Sulfa antibiotics  No family history on file.  Social History History  Substance Use Topics  . Smoking status: Never Smoker   . Smokeless tobacco: Not on file  . Alcohol Use: No    Review of Systems  Constitutional: Positive for fever. Positive for generalized weakness Cardiovascular: Negative for chest pain. Respiratory: Positive for cough Gastrointestinal: Positive for lower abdominal pain Genitourinary: Positive for dysuria. Musculoskeletal: Negative for back pain. Skin: Negative for rash. Neurological: Negative for headaches, focal weakness or numbness.   10-point ROS otherwise negative.  ____________________________________________   PHYSICAL EXAM:  VITAL SIGNS: ED Triage Vitals  Enc Vitals Group     BP  --      Pulse Rate 01/31/15 0933 147     Resp 01/31/15 0933 30     Temp 01/31/15 0933 102.7 F (39.3 C)     Temp Source 01/31/15 0933 Rectal     SpO2 01/31/15 0933 95 %     Weight 01/31/15 0933 147 lb (66.679 kg)     Height 01/31/15 0933 5\' 7"  (1.702 m)   Constitutional: Alert and oriented. Well appearing and in no distress. Eyes: Conjunctivae are normal. PERRL. Normal extraocular movements. ENT   Head: Normocephalic and atraumatic.   Nose: No congestion/rhinnorhea.   Mouth/Throat: Mucous membranes are moist.   Neck: No stridor. Hematological/Lymphatic/Immunilogical: No cervical lymphadenopathy. Cardiovascular: Tachycardic Respiratory: Slightly increased work of breathing with bilateral wheezing.  Gastrointestinal: Soft, minimally tender in the lower abdomen. Genitourinary: Deferred Musculoskeletal: Normal range of motion in all extremities. No joint effusions.  No lower extremity tenderness nor edema. Neurologic:  Normal speech and language. No gross focal neurologic deficits are appreciated. Speech is normal.  Skin:  Skin is warm, dry and intact. No rash noted. Psychiatric: Mood and affect are normal. Speech and behavior are normal. Patient exhibits appropriate insight and judgment.  ____________________________________________    LABS (pertinent positives/negatives)  Labs Reviewed  COMPREHENSIVE METABOLIC PANEL - Abnormal; Notable for the following:    Sodium 131 (*)    Chloride 98 (*)    CO2 20 (*)    Glucose, Bld 116 (*)    BUN 29 (*)    Creatinine, Ser 2.26 (*)    ALT 11 (*)  Alkaline Phosphatase 172 (*)    GFR calc non Af Amer 20 (*)    GFR calc Af Amer 24 (*)    All other components within normal limits  CBC WITH DIFFERENTIAL/PLATELET - Abnormal; Notable for the following:    RBC 3.64 (*)    Hemoglobin 10.5 (*)    HCT 32.4 (*)    RDW 16.1 (*)    Lymphs Abs 0.4 (*)    Monocytes Absolute 0.0 (*)    All other components within normal limits   LACTIC ACID, PLASMA - Abnormal; Notable for the following:    Lactic Acid, Venous 3.2 (*)    All other components within normal limits  TROPONIN I - Abnormal; Notable for the following:    Troponin I 0.05 (*)    All other components within normal limits  URINALYSIS COMPLETEWITH MICROSCOPIC ([facility] ONLY) - Abnormal; Notable for the following:    Color, Urine YELLOW (*)    APPearance HAZY (*)    Hgb urine dipstick 2+ (*)    Protein, ur 30 (*)    Leukocytes, UA 1+ (*)    Bacteria, UA MANY (*)    All other components within normal limits  CULTURE, BLOOD (ROUTINE X 2)  CULTURE, BLOOD (ROUTINE X 2)  URINE CULTURE  LIPASE, BLOOD  LACTIC ACID, PLASMA     ____________________________________________   EKG  I, Phineas Semen, attending physician, personally viewed and interpreted this EKG  EKG Time: 0928 Rate: 145 Rhythm: sinus tachycardia Axis: normal Intervals: qtc 419 QRS: narrow ST changes:  No st elevation    ____________________________________________    RADIOLOGY  Three-way abdomen  IMPRESSION: COPD. No active cardiopulmonary disease.  Prior cholecystectomy.  No evidence of bowel obstruction or free air.  ____________________________________________   PROCEDURES  Procedure(s) performed: None  Critical Care performed: Yes, see critical care note(s) CRITICAL CARE Performed by: Phineas Semen   Total critical care time: 30  Critical care time was exclusive of separately billable procedures and treating other patients.  Critical care was necessary to treat or prevent imminent or life-threatening deterioration.  Critical care was time spent personally by me on the following activities: development of treatment plan with patient and/or surrogate as well as nursing, discussions with consultants, evaluation of patient's response to treatment, examination of patient, obtaining history from patient or surrogate, ordering and performing treatments and  interventions, ordering and review of laboratory studies, ordering and review of radiographic studies, pulse oximetry and re-evaluation of patient's condition.   ____________________________________________   INITIAL IMPRESSION / ASSESSMENT AND PLAN / ED COURSE  Pertinent labs & imaging results that were available during my care of the patient were reviewed by me and considered in my medical decision making (see chart for details).  Patient presented to the emergency department today with multiple symptoms for the past 2-3 days. On initial encounter. She is tachycardic and febrile. Code sepsis was called. Patient was given broad-spectrum antibiotics and fluids. Patient did seem to respond positively to the fluids. Blood work and urine 0.2 a perhaps a urinary source. This would go along clinically with patient's dysuria and lower abdominal pain. Patient will be admitted to the hospital for further IV fluids and antibiotics and workup.  ____________________________________________   FINAL CLINICAL IMPRESSION(S) / ED DIAGNOSES  Final diagnoses:  Sepsis, due to unspecified organism  UTI (lower urinary tract infection)     Phineas Semen, MD 01/31/15 1620

## 2015-01-31 NOTE — ED Notes (Signed)
BIB EMS from home c/o Weakness, cough with productive light green sputum, fever, lower abd pain and vomiting for past 3 days.

## 2015-01-31 NOTE — ED Notes (Signed)
[facility]epsis to [facility] per Dr. Derrill KayGoodman  [phone]

## 2015-02-01 ENCOUNTER — Inpatient Hospital Stay: Payer: Medicare Other

## 2015-02-01 LAB — BASIC METABOLIC PANEL
Anion gap: 8 (ref 5–15)
BUN: 24 mg/dL — ABNORMAL HIGH (ref 6–20)
CO2: 23 mmol/L (ref 22–32)
CREATININE: 1.88 mg/dL — AB (ref 0.44–1.00)
Calcium: 8.9 mg/dL (ref 8.9–10.3)
Chloride: 108 mmol/L (ref 101–111)
GFR calc Af Amer: 30 mL/min — ABNORMAL LOW (ref >60)
GFR calc non Af Amer: 26 mL/min — ABNORMAL LOW (ref >60)
Glucose, Bld: 102 mg/dL — ABNORMAL HIGH (ref 65–99)
Potassium: 3.9 mmol/L (ref 3.5–5.1)
Sodium: 139 mmol/L (ref 135–145)

## 2015-02-01 LAB — CBC
HCT: 31.1 % — ABNORMAL LOW (ref 35.0–47.0)
HEMOGLOBIN: 9.8 g/dL — AB (ref 12.0–16.0)
MCH: 28.4 pg (ref 26.0–34.0)
MCHC: 31.4 g/dL — AB (ref 32.0–36.0)
MCV: 90.4 fL (ref 80.0–100.0)
Platelets: 274 10*3/uL (ref 150–440)
RBC: 3.44 MIL/uL — ABNORMAL LOW (ref 3.80–5.20)
RDW: 15.9 % — ABNORMAL HIGH (ref 11.5–14.5)
WBC: 13.7 10*3/uL — AB (ref 3.6–11.0)

## 2015-02-01 LAB — LACTIC ACID, PLASMA
Lactic Acid, Venous: 1.1 mmol/L (ref 0.5–2.0)
Lactic Acid, Venous: 1.4 mmol/L (ref 0.5–2.0)

## 2015-02-01 MED ORDER — GUAIFENESIN 100 MG/5ML PO SYRP
200.0000 mg | ORAL_SOLUTION | Freq: Four times a day (QID) | ORAL | Status: DC | PRN
  Administered 2015-02-01: 200 mg via ORAL
  Filled 2015-02-01 (×3): qty 10

## 2015-02-01 MED ORDER — TROLAMINE SALICYLATE 10 % EX CREA
TOPICAL_CREAM | Freq: Two times a day (BID) | CUTANEOUS | Status: DC | PRN
  Administered 2015-02-01: 18:00:00 via TOPICAL
  Filled 2015-02-01: qty 85

## 2015-02-01 MED ORDER — PIPERACILLIN-TAZOBACTAM 3.375 G IVPB
3.3750 g | Freq: Three times a day (TID) | INTRAVENOUS | Status: DC
  Administered 2015-02-01 – 2015-02-03 (×7): 3.375 g via INTRAVENOUS
  Filled 2015-02-01 (×11): qty 50

## 2015-02-01 NOTE — Progress Notes (Signed)
ANTIBIOTIC CONSULT NOTE - INITIAL  Pharmacy Consult for Zosyn Indication: Sepsis/UTI  Allergies  Allergen Reactions  . Sulfa Antibiotics Rash    Vital Signs: Temp: 98.9 F (37.2 C) (06/09 0622) Temp Source: Oral (06/08 2128) BP: 147/57 mmHg (06/09 0622) Pulse Rate: 122 (06/09 0622)  Labs:  Recent Labs  01/31/15 0949 02/01/15 0509  WBC 7.0 13.7*  HGB 10.5* 9.8*  PLT 281 274  CREATININE 2.26* 1.88*   Estimated Creatinine Clearance: 26.3 mL/min (by C-G formula based on Cr of 1.88).  Microbiology: Recent Results (from the past 720 hour(s))  Blood Culture (routine x 2)     Status: None (Preliminary result)   Collection Time: 01/31/15  9:21 AM  Result Value Ref Range Status   Specimen Description BLOOD  Final   Special Requests BLOOD  Final   Culture   Final    GRAM NEGATIVE RODS ANAEROBIC BOTTLE ONLY CRITICAL VALUE PREVIOUSLY NOTIFIED CONFIRMED BY [facility]    Report Status PENDING  Incomplete  Blood Culture (routine x 2)     Status: None (Preliminary result)   Collection Time: 01/31/15  9:21 AM  Result Value Ref Range Status   Specimen Description BLOOD  Final   Special Requests BLOOD  Final   Culture   Final    GRAM NEGATIVE RODS ANAEROBIC BOTTLE ONLY CRITICAL RESULT CALLED TO, READ BACK BY AND VERIFIED WITH: MARCELL TURNER @0032  02/01/15.Marland KitchenAJO CONFIRMED BY [facility]    Report Status PENDING  Incomplete    Medical History: Past Medical History  Diagnosis Date  . COPD (chronic obstructive pulmonary disease)   . COPD (chronic obstructive pulmonary disease)   . Acute MI   . Anxiety   . Hypertension   . GERD (gastroesophageal reflux disease)   . CKD stage G3b/A3, GFR 30 - 44 and albumin creatinine ratio >300 mg/g     Medications:  Anti-infectives    Start     Dose/Rate Route Frequency Ordered Stop   02/01/15 2200  vancomycin (VANCOCIN) IVPB 1000 mg/200 mL premix  Status:  Discontinued     1,000 mg 200 mL/hr over 60 Minutes Intravenous Every 36 hours 01/31/15 1130  01/31/15 1439   02/01/15 0930  piperacillin-tazobactam (ZOSYN) IVPB 3.375 g     3.375 g 12.5 mL/hr over 240 Minutes Intravenous 3 times per day 02/01/15 0808     01/31/15 1800  piperacillin-tazobactam (ZOSYN) IVPB 3.375 g  Status:  Discontinued     3.375 g 12.5 mL/hr over 240 Minutes Intravenous 3 times per day 01/31/15 1131 01/31/15 1439   01/31/15 1445  cefTRIAXone (ROCEPHIN) 1 g in dextrose 5 % 50 mL IVPB - Premix  Status:  Discontinued     1 g 100 mL/hr over 30 Minutes Intravenous Every 24 hours 01/31/15 1435 02/01/15 0807   01/31/15 0947  vancomycin (VANCOCIN) 1 GM/200ML IVPB    Comments:  newsholme, katie: cabinet override      01/31/15 0947 01/31/15 1007   01/31/15 0947  piperacillin-tazobactam (ZOSYN) 3.375 GM/50ML IVPB    Comments:  newsholme, katie: cabinet override      01/31/15 0947 01/31/15 1007   01/31/15 0945  piperacillin-tazobactam (ZOSYN) IVPB 3.375 g     3.375 g 100 mL/hr over 30 Minutes Intravenous  Once 01/31/15 0941 01/31/15 1101   01/31/15 0945  vancomycin (VANCOCIN) IVPB 1000 mg/200 mL premix     1,000 mg 200 mL/hr over 60 Minutes Intravenous  Once 01/31/15 0941 01/31/15 1101     Assessment: Patient admitted with UTI/Sepsis, received  doses of zosyn and vancomycin in the ED, transitioned to ceftriaxone. Now with GNR in 2/2 blood cultures, will broaden gram negative coverage with zosyn  Plan:  Orderd zosyn 3.375gm IV Q8H extended infusion. Will continue to follow culture results and monitor renal function.   Pharmacy to follow per consult  Garlon Hatchet, PharmD Clinical Pharmacist   02/01/2015,8:13 AM

## 2015-02-01 NOTE — Progress Notes (Signed)
[facility] [place] [facility]   PATIENT NAME: Carolyn Lewis    MR#:  [id number]  DATE OF BIRTH:  1940/08/30  SUBJECTIVE:  Patient is doing well today. Patient still has elevated heart rates. Patient was noted to have increased oxygen requirement while working with physical therapy. Her husband is at bedside he says that when she walks she needs 4 L of oxygen.  REVIEW OF SYSTEMS:    Review of Systems  Constitutional: Positive for chills and malaise/fatigue. Negative for fever.  Respiratory: Negative for cough and sputum production.   Gastrointestinal: Negative for nausea, constipation and blood in stool.  Genitourinary: Negative for dysuria, urgency, frequency and hematuria.  Neurological: Negative for dizziness, tingling, tremors and headaches.    Tolerating Diet: Yes      DRUG ALLERGIES:   Allergies  Allergen Reactions  . Sulfa Antibiotics Rash    VITALS:  Blood pressure 157/72, pulse 108, temperature 98.1 F (36.7 C), temperature source Oral, resp. rate 17, height 5\' 7"  (1.702 m), weight 66.679 kg (147 lb), SpO2 90 %.  PHYSICAL EXAMINATION:   Physical Exam  Constitutional: She is oriented to person, place, and time and well-developed, well-nourished, and in no distress.  HENT:  Head: Normocephalic and atraumatic.  Eyes: Pupils are equal, round, and reactive to light.  Neck: Normal range of motion. No tracheal deviation present. No thyromegaly present.  Cardiovascular: Normal rate and regular rhythm.   No murmur heard. Pulmonary/Chest: Effort normal and breath sounds normal. No respiratory distress. She has no wheezes.  Abdominal: Soft. Bowel sounds are normal. There is no tenderness.  Musculoskeletal: Normal range of motion.  Neurological: She is alert and oriented to person, place, and time.  Skin: Skin is warm and dry.  Psychiatric: Affect normal.      LABORATORY PANEL:   CBC  Recent Labs Lab 02/01/15 0509  WBC 13.7*   HGB 9.8*  HCT 31.1*  PLT 274   ------------------------------------------------------------------------------------------------------------------  Chemistries   Recent Labs Lab 01/31/15 0949 02/01/15 0509  NA 131* 139  K 3.9 3.9  CL 98* 108  CO2 20* 23  GLUCOSE 116* 102*  BUN 29* 24*  CREATININE 2.26* 1.88*  CALCIUM 8.9 8.9  AST 31  --   ALT 11*  --   ALKPHOS 172*  --   BILITOT 0.7  --    ------------------------------------------------------------------------------------------------------------------  Cardiac Enzymes  Recent Labs Lab 01/31/15 0949  TROPONINI 0.05*   ------------------------------------------------------------------------------------------------------------------  RADIOLOGY:     ASSESSMENT AND PLAN:    This is a 74 year old female with a history of CAD, COPD with chronic oxygen at 2 L who presented with sepsis and now found to have gram-negative rod bacteremia.  1. Sepsis from urinary tract infection: Patient has gram-negative rod bacteremia. We have broaden her anti-biotics and discontinued Rocephin. She is now on Zosyn. I will follow-up on final blood and urine culture.  2. Urinary tract infection: As mentioned blood cultures are gram-negative rods. Her urine culture is still pending however I suspect that this will also be gram-negative rods. I will continue with Zosyn.  3. Chronic respiratory failure with 2 L of oxygen secondary to COPD: Patient does use 4 L of oxygen on exertion is according to her husband. Patient will continue on this regimen.  4. CAD: Patient will continue her outpatient medications including Plavix, atorvastatin, metoprolol, nitroglycerin when necessary and Norvasc.   5. Hyponatremia: This seems to have resolved after IV fluids.  6. Sinus tachycardia: This is  secondary sepsis. Patient will continue with telemetry monitoring.      Management discussed with the patient and husband and they are in  agreement.  CODE STATUS: Full  TOTAL TIME TAKING CARE OF THIS PATIENT: 33 minutes.   POSSIBLE D/C IN 2 DAYS, DEPENDING ON CLINICAL CONDITION.   Morgan Rennert M.D on 02/01/2015 at 11:34 AM  Between 7am to 6pm - Pager - [phone] After 6pm go to [email or web address] - password [email or web address] [facility]  [facility]  Office  [phone]  CC: Primary care physician; Corky Downs, MD

## 2015-02-01 NOTE — Progress Notes (Signed)
Notified physician per patient request for cough suppressant. Orders received.

## 2015-02-01 NOTE — Progress Notes (Signed)
Notified physician about gram negatives rods in anaerobic bottle. No new orders given.

## 2015-02-01 NOTE — Evaluation (Signed)
Physical Therapy Evaluation Patient Details Name: Carolyn Lewis MRN: [id number] DOB: 07-26-1941 Today's Date: 02/01/2015   History of Present Illness  presented to ER secondary to fever, chills and progressive weakness; admitted with sepsis related to UTI.    Clinical Impression  Upon evaluation, patient alert and oriented; follows all commands and demonstrates good insight/safety awareness.  Strength and ROM grossly WFL throughout all extremities.  Able to complete bed mobility with cga; sit/stand, basic transfers and short-distance gait (25') with RW, cga/min assist.  Desat to 86% on 3L with exertion, requiring 1-2 min seated rest and pursed lip breathing for recovery.  Higher-level balance deficits and mild/mod cardiopulmonary deficits noted; would benefit from continued use of RW for all mobility at this time. Would benefit from skilled PT to address above deficits and promote optimal return to PLOF;Recommend transition to HHPT upon discharge from acute hospitalization. Patient/family aware of recommendations and in agreement with plan.     Follow Up Recommendations Home health PT    Equipment Recommendations  3in1 (PT)    Recommendations for Other Services       Precautions / Restrictions Precautions Precautions: Fall Restrictions Weight Bearing Restrictions: No      Mobility  Bed Mobility Overal bed mobility: Needs Assistance Bed Mobility: Supine to Sit     Supine to sit: Min guard     General bed mobility comments: increased time to complete  Transfers Overall transfer level: Needs assistance Equipment used: Rolling walker (2 wheeled) Transfers: Sit to/from Stand Sit to Stand: Min guard;Min assist            Ambulation/Gait Ambulation/Gait assistance: Min guard;Min assist Ambulation Distance (Feet): 25 Feet Assistive device: Rolling walker (2 wheeled)     Gait velocity interpretation: <1.8 ft/sec, indicative of risk for recurrent falls General Gait  Details: reciprocal stepping with slow, guarded gait performance. No overt LOB (declined out of room due to lack of undergarments).  Desat to 86% on 3L with exertion requiring 60-90 seconds of seated rest and pursed lip breathing for recovery.  Stairs            Wheelchair Mobility    Modified Rankin (Stroke Patients Only)       Balance Overall balance assessment: Needs assistance Sitting-balance support: No upper extremity supported;Feet supported Sitting balance-Leahy Scale: Good     Standing balance support: Bilateral upper extremity supported Standing balance-Leahy Scale: Fair                               Pertinent Vitals/Pain Pain Assessment: Faces Faces Pain Scale: Hurts a little bit Pain Location: L low back Pain Descriptors / Indicators: Aching Pain Intervention(s): Monitored during session;Repositioned;Limited activity within patient's tolerance    Home Living Family/patient expects to be discharged to:: Private residence Living Arrangements: Spouse/significant other Available Help at Discharge: Family Type of Home: House Home Access: Stairs to enter Entrance Stairs-Rails: Right Entrance Stairs-Number of Steps: 3 Home Layout: One level Home Equipment: Walker - 2 wheels      Prior Function Level of Independence: Independent with assistive device(s)         Comments: Mod indep without assist device for basic, household mobility; husband assists with ADLs and household chores as needed.  Deneis recent fall history.  Chronic home O2 at 2-3L (up to 4L with exertion)     Hand Dominance        Extremity/Trunk Assessment   Upper Extremity Assessment: Overall  WFL for tasks assessed           Lower Extremity Assessment: Overall WFL for tasks assessed         Communication   Communication: No difficulties  Cognition Arousal/Alertness: Awake/alert Behavior During Therapy: WFL for tasks assessed/performed Overall Cognitive Status:  Within Functional Limits for tasks assessed                      General Comments      Exercises Other Exercises Other Exercises: Toilet transfer, SPT with RW, cga. Min cuing for hand placement with sit/stand from various surfaces (unable to complete without UE support).  Standing balance for hygiene and clothing management, cga/mina ssist with RW (8 minutes)      Assessment/Plan    PT Assessment Patient needs continued PT services  PT Diagnosis Difficulty walking;Generalized weakness   PT Problem List Decreased strength;Decreased activity tolerance;Decreased balance;Decreased mobility;Decreased knowledge of use of DME;Decreased safety awareness;Decreased knowledge of precautions;Cardiopulmonary status limiting activity  PT Treatment Interventions DME instruction;Gait training;Stair training;Functional mobility training;Therapeutic activities;Therapeutic exercise;Balance training;Patient/family education   PT Goals (Current goals can be found in the Care Plan section) Acute Rehab PT Goals Patient Stated Goal: "to get up from the bed" PT Goal Formulation: With patient Time For Goal Achievement: 02/15/15 Potential to Achieve Goals: Good    Frequency Min 2X/week   Barriers to discharge        Co-evaluation               End of Session Equipment Utilized During Treatment: Gait belt Activity Tolerance: Patient tolerated treatment well Patient left: in chair;with call bell/phone within reach;with chair alarm set;with family/visitor present Nurse Communication: Mobility status         Time: 0955-1030 PT Time Calculation (min) (ACUTE ONLY): 35 min   Charges:   PT Evaluation $Initial PT Evaluation Tier I: 1 Procedure PT Treatments $Therapeutic Activity: 8-22 mins   PT G Codes:       Iseah Plouff H. Manson Passey, PT, DPT 02/01/2015, 11:04 AM [phone]

## 2015-02-02 ENCOUNTER — Inpatient Hospital Stay: Payer: Medicare Other

## 2015-02-02 LAB — BASIC METABOLIC PANEL
Anion gap: 10 (ref 5–15)
BUN: 13 mg/dL (ref 6–20)
CO2: 24 mmol/L (ref 22–32)
Calcium: 8.8 mg/dL — ABNORMAL LOW (ref 8.9–10.3)
Chloride: 103 mmol/L (ref 101–111)
Creatinine, Ser: 1.51 mg/dL — ABNORMAL HIGH (ref 0.44–1.00)
GFR calc Af Amer: 39 mL/min — ABNORMAL LOW (ref >60)
GFR, EST NON AFRICAN AMERICAN: 33 mL/min — AB (ref >60)
Glucose, Bld: 125 mg/dL — ABNORMAL HIGH (ref 65–99)
Potassium: 3.2 mmol/L — ABNORMAL LOW (ref 3.5–5.1)
Sodium: 137 mmol/L (ref 135–145)

## 2015-02-02 MED ORDER — METHYLPREDNISOLONE SODIUM SUCC 125 MG IJ SOLR
60.0000 mg | INTRAMUSCULAR | Status: DC
  Administered 2015-02-02: 60 mg via INTRAVENOUS
  Filled 2015-02-02: qty 2

## 2015-02-02 MED ORDER — AMLODIPINE BESYLATE 10 MG PO TABS
10.0000 mg | ORAL_TABLET | Freq: Every day | ORAL | Status: DC
  Administered 2015-02-02 – 2015-02-03 (×2): 10 mg via ORAL
  Filled 2015-02-02 (×2): qty 1

## 2015-02-02 MED ORDER — POTASSIUM CHLORIDE 20 MEQ PO PACK
20.0000 meq | PACK | Freq: Two times a day (BID) | ORAL | Status: DC
  Administered 2015-02-02 – 2015-02-03 (×3): 20 meq via ORAL
  Filled 2015-02-02 (×3): qty 1

## 2015-02-02 MED ORDER — SODIUM CHLORIDE 0.9 % IJ SOLN: 3.0000 mL | INTRAMUSCULAR | Status: DC | PRN

## 2015-02-02 MED ORDER — METOPROLOL TARTRATE 50 MG PO TABS
50.0000 mg | ORAL_TABLET | Freq: Two times a day (BID) | ORAL | Status: DC
  Administered 2015-02-02 – 2015-02-03 (×3): 50 mg via ORAL
  Filled 2015-02-02 (×3): qty 1

## 2015-02-02 MED ORDER — IPRATROPIUM-ALBUTEROL 0.5-2.5 (3) MG/3ML IN SOLN
3.0000 mL | RESPIRATORY_TRACT | Status: DC
  Administered 2015-02-02 – 2015-02-03 (×7): 3 mL via RESPIRATORY_TRACT
  Filled 2015-02-02 (×6): qty 3

## 2015-02-02 NOTE — Progress Notes (Signed)
[facility] - [place] at [facility]   PATIENT NAME: Carolyn Lewis    MR#:  [id number]  DATE OF BIRTH:  04-24-1941  SUBJECTIVE:  No acute events overnight. Patient does have wheezing today.  REVIEW OF SYSTEMS:    Review of Systems  Constitutional: Positive for malaise/fatigue. Negative for fever and chills.  HENT: Negative for ear pain.   Respiratory: Negative for cough and sputum production.   Cardiovascular: Positive for palpitations (when she moves she feels inc HR but says thsi is usual for  her).  Gastrointestinal: Negative for nausea, constipation and blood in stool.  Genitourinary: Negative for dysuria, urgency, frequency and hematuria.  Musculoskeletal: Negative for myalgias and back pain.  Neurological: Negative for dizziness, tingling, tremors and headaches.    Tolerating Diet: Yes      DRUG ALLERGIES:   Allergies  Allergen Reactions  . Sulfa Antibiotics Rash    VITALS:  Blood pressure 158/76, pulse 112, temperature 98.2 F (36.8 C), temperature source Oral, resp. rate 20, height  (1.702 m), weight 74.39 kg (164 lb), SpO2 92 %.  PHYSICAL EXAMINATION:   Physical Exam  Constitutional: She is oriented to person, place, and time and well-developed, well-nourished, and in no distress.  HENT:  Head: Normocephalic and atraumatic.  Eyes: Pupils are equal, round, and reactive to light.  Neck: Normal range of motion. No tracheal deviation present. No thyromegaly present.  Cardiovascular: Regular rhythm.   No murmur heard. tachycardia  Pulmonary/Chest: Effort normal. No respiratory distress. She has wheezes. She has no rales. She exhibits no tenderness.  Abdominal: Soft. Bowel sounds are normal. There is no tenderness.  Musculoskeletal: Normal range of motion.  Neurological: She is alert and oriented to person, place, and time.  Skin: Skin is warm and dry.  Psychiatric: Affect and judgment normal.      LABORATORY PANEL:    CBC  Recent Labs Lab 02/01/15 0509  WBC 13.7*  HGB 9.8*  HCT 31.1*  PLT 274   ------------------------------------------------------------------------------------------------------------------  Chemistries   Recent Labs Lab 01/31/15 0949 02/01/15 0509  NA 131* 139  K 3.9 3.9  CL 98* 108  CO2 20* 23  GLUCOSE 116* 102*  BUN 29* 24*  CREATININE 2.26* 1.88*  CALCIUM 8.9 8.9  AST 31  --   ALT 11*  --   ALKPHOS 172*  --   BILITOT 0.7  --    ------------------------------------------------------------------------------------------------------------------  Cardiac Enzymes  Recent Labs Lab 01/31/15 0949  TROPONINI 0.05*   ------------------------------------------------------------------------------------------------------------------  RADIOLOGY:     ASSESSMENT AND PLAN:    This is a 74 year old female with a history of CAD, COPD with chronic oxygen at 2 L who presented with sepsis and now found to have gram-negative rod bacteremia.  1. Sepsis from urinary tract infection: I am awaiting final blood and urine culture. So far cultures are resulting as gram-negative rods. I will continue Zosyn  2. Urinary tract infection: As mentioned blood cultures are gram-negative rods. Her urine culture is still pending however I suspect that this will also be gram-negative rods. I will continue with Zosyn.  3. Chronic respiratory failure with 2 L of oxygen secondary to COPD: Patient does use 4 L of oxygen on exertion is according to her husband. Patient appears to have wheezing today. I will order chest x-ray. I will also order IV steroid-induced and DuoNeb's.   4. CAD: Patient will continue her outpatient medications including Plavix, atorvastatin, metoprolol, nitroglycerin when necessary and Norvasc.   5. Hyponatremia:  This seems to have resolved after IV fluids.  6. Sinus tachycardia: This is secondary sepsis. Her blood pressure will tolerate an increase in the beta  blocker.  Patient will continue with telemetry monitoring.      Management discussed with the patient and husband and they are in agreement.  CODE STATUS: Full  TOTAL TIME TAKING CARE OF THIS PATIENT: 25 minutes.   POSSIBLE D/C IN 2-3 DAYS, DEPENDING ON CLINICAL CONDITION.  PER PHYSICAL THERAPY CONSULTATION HOME WITH HOME HEALTH   Lanis Storlie M.D on 02/02/2015 at 8:54 AM  Between 7am to 6pm - Pager - [phone] After 6pm go to [email or web address] - password [email or web address] [facility]  [facility]  Office  [phone]  CC: Primary care physician; Corky Downs, MD

## 2015-02-02 NOTE — Progress Notes (Signed)
Physical Therapy Treatment Patient Details Name: Carolyn Lewis MRN: [id number] DOB: 1940/09/11 Today's Date: 02/02/2015    History of Present Illness presented to ER secondary to fever, chills and progressive weakness; admitted with sepsis related to UTI.      PT Comments    Progressive increase in gait distance, cga/close sup with RW.  Generally slow and guarded with all mobility; recommend continued use of RW for external stabilization and for energy conservation with all mobility. Desat to 85-86% on 3L O2 with activity (HR to 126), but recovers to >92% within 90 seconds of seated rest and pursed lip breathing.  Returned to 2L end of session.  RN informed/aware.  Follow Up Recommendations  Home health PT     Equipment Recommendations  3in1 (PT)    Recommendations for Other Services       Precautions / Restrictions Precautions Precautions: Fall Restrictions Weight Bearing Restrictions: No    Mobility  Bed Mobility Overal bed mobility: Modified Independent Bed Mobility: Supine to Sit;Sit to Supine              Transfers Overall transfer level: Needs assistance Equipment used: Rolling walker (2 wheeled) Transfers: Sit to/from Stand Sit to Stand: Supervision;Min guard            Ambulation/Gait Ambulation/Gait assistance: Min guard Ambulation Distance (Feet): 50 Feet Assistive device: Rolling walker (2 wheeled)       General Gait Details: progressive increase in gait distance; slow and guarded.  Very slow, effortful turns with limited ability to respond to external perturbation.  Would benefit from continued use of RW for balance and for energy conservation.   Stairs            Wheelchair Mobility    Modified Rankin (Stroke Patients Only)       Balance                                    Cognition                            Exercises Other Exercises Other Exercises: Toilet transfer, ambulatory with Rw, cga/close  sup; able to negotiate surface change with RW with sup/cga from therapist.  Sit/stand from standard height toilet, close sup with use of grab bar.  Standing balance for clothing management, hygiene and hand hygiene at sink, close sup. Other Exercises: 5x sit/stand without assist device, close sup, 23 seconds.  Good LE strength; limited cardiopulmonary endurnace with activity.    General Comments        Pertinent Vitals/Pain Pain Assessment: No/denies pain    Home Living                      Prior Function            PT Goals (current goals can now be found in the care plan section) Acute Rehab PT Goals Patient Stated Goal: "to get up from the bed" PT Goal Formulation: With patient Time For Goal Achievement: 02/15/15 Potential to Achieve Goals: Good Progress towards PT goals: Progressing toward goals    Frequency  Min 2X/week    PT Plan Current plan remains appropriate    Co-evaluation             End of Session Equipment Utilized During Treatment: Gait belt Activity Tolerance: Patient tolerated treatment well Patient left:  with family/visitor present;in bed;with call bell/phone within reach;with bed alarm set     Time: 1510-1536 PT Time Calculation (min) (ACUTE ONLY): 26 min  Charges:  $Gait Training: 8-22 mins $Therapeutic Activity: 8-22 mins                    G Codes:      Laury Axon 02-18-15, 4:54 PM

## 2015-02-02 NOTE — Progress Notes (Signed)
ANTIBIOTIC CONSULT NOTE -Follow up  Pharmacy Consult for Zosyn Indication: Sepsis/UTI  Allergies  Allergen Reactions  . Sulfa Antibiotics Rash    Vital Signs: Temp: 98.2 F (36.8 C) (06/10 1117) Temp Source: Oral (06/10 1117) BP: 134/57 mmHg (06/10 1117) Pulse Rate: 96 (06/10 1117)  Labs:  Recent Labs  01/31/15 0949 02/01/15 0509 02/02/15 0803  WBC 7.0 13.7*  --   HGB 10.5* 9.8*  --   PLT 281 274  --   CREATININE 2.26* 1.88* 1.51*   Estimated Creatinine Clearance: 35.5 mL/min (by C-G formula based on Cr of 1.51).  Microbiology: Recent Results (from the past 720 hour(s))  Blood Culture (routine x 2)     Status: None (Preliminary result)   Collection Time: 01/31/15  9:21 AM  Result Value Ref Range Status   Specimen Description BLOOD  Final   Special Requests BLOOD  Final   Culture  Setup Time   Final    GRAM NEGATIVE RODS ANAEROBIC BOTTLE ONLY CONFIRMED BY [facility] CRITICAL VALUE NOTED.  VALUE IS CONSISTENT WITH PREVIOUSLY REPORTED AND CALLED VALUE. GRAM POSITIVE COCCI    Culture   Final    GRAM NEGATIVE RODS ANAEROBIC BOTTLE ONLY IDENTIFICATION TO FOLLOW SUSCEPTIBILITIES TO FOLLOW COAGULASE NEGATIVE STAPHYLOCOCCUS POSSIBLE CONTAMINATE AEROBIC BOTTLE ONLY    Report Status PENDING  Incomplete  Blood Culture (routine x 2)     Status: None (Preliminary result)   Collection Time: 01/31/15  9:21 AM  Result Value Ref Range Status   Specimen Description BLOOD  Final   Special Requests BLOOD  Final   Culture  Setup Time GRAM NEGATIVE RODS ANAEROBIC BOTTLE ONLY   Final   Culture   Final    GRAM NEGATIVE RODS IDENTIFICATION TO FOLLOW SUSCEPTIBILITIES TO FOLLOW ANAEROBIC BOTTLE ONLY CRITICAL RESULT CALLED TO, READ BACK BY AND VERIFIED WITH: MARCELL TURNER  02/01/15.Marland KitchenAJO CONFIRMED BY [facility]    Report Status PENDING  Incomplete  Urine culture     Status: None (Preliminary result)   Collection Time: 01/31/15 10:29 AM  Result Value Ref Range Status   Specimen  Description URINE, CLEAN CATCH  Final   Special Requests NONE  Final   Culture   Final    >=100,000 COLONIES/mL GRAM NEGATIVE RODS 70,000 COLONIES/ml GRAM NEGATIVE RODS DIFFERENT MORPHOLOGY IDENTIFICATION TO FOLLOW SUSCEPTIBILITIES TO FOLLOW    Report Status PENDING  Incomplete    Medical History: Past Medical History  Diagnosis Date  . COPD (chronic obstructive pulmonary disease)   . COPD (chronic obstructive pulmonary disease)   . Acute MI   . Anxiety   . Hypertension   . GERD (gastroesophageal reflux disease)   . CKD stage G3b/A3, GFR 30 - 44 and albumin creatinine ratio >300 mg/g     Medications:  Anti-infectives    Start     Dose/Rate Route Frequency Ordered Stop   02/01/15 2200  vancomycin (VANCOCIN) IVPB 1000 mg/200 mL premix  Status:  Discontinued     1,000 mg 200 mL/hr over 60 Minutes Intravenous Every 36 hours 01/31/15 1130 01/31/15 1439   02/01/15 0930  piperacillin-tazobactam (ZOSYN) IVPB 3.375 g     3.375 g 12.5 mL/hr over 240 Minutes Intravenous 3 times per day 02/01/15 0808     01/31/15 1800  piperacillin-tazobactam (ZOSYN) IVPB 3.375 g  Status:  Discontinued     3.375 g 12.5 mL/hr over 240 Minutes Intravenous 3 times per day 01/31/15 1131 01/31/15 1439   01/31/15 1445  cefTRIAXone (ROCEPHIN) 1 g in dextrose 5 %  50 mL IVPB - Premix  Status:  Discontinued     1 g 100 mL/hr over 30 Minutes Intravenous Every 24 hours 01/31/15 1435 02/01/15 0807   01/31/15 0947  vancomycin (VANCOCIN) 1 GM/200ML IVPB    Comments:  newsholme, katie: cabinet override      01/31/15 0947 01/31/15 1007   01/31/15 0947  piperacillin-tazobactam (ZOSYN) 3.375 GM/50ML IVPB    Comments:  newsholme, katie: cabinet override      01/31/15 0947 01/31/15 1007   01/31/15 0945  piperacillin-tazobactam (ZOSYN) IVPB 3.375 g     3.375 g 100 mL/hr over 30 Minutes Intravenous  Once 01/31/15 0941 01/31/15 1101   01/31/15 0945  vancomycin (VANCOCIN) IVPB 1000 mg/200 mL premix     1,000 mg 200 mL/hr  over 60 Minutes Intravenous  Once 01/31/15 0941 01/31/15 1101     Assessment: Patient admitted with UTI/Sepsis, received doses of zosyn and vancomycin in the ED, transitioned to ceftriaxone. Now with GNR in 2/2 blood cultures/urine cx, will broaden gram negative coverage with zosyn  Plan:  Orderd zosyn 3.375gm IV Q8H extended infusion. Will continue to follow culture results and monitor renal function.   Pharmacy to follow per consult  Bari Mantis PharmD Clinical Pharmacist 02/02/2015

## 2015-02-02 NOTE — Care Management (Signed)
Patient presents from home where she lives with her husband.  She is on chronic home 02 and is able to ambulate independently but sometimes depending on how she feels may need some assist with her bath.  Denies any issues accessing medical care or meds.  She does not think she is going to need any home health or other assist when she is discharged.  Verbalized understanding to have her portable 02 tank brought to the hospital for transport home

## 2015-02-03 LAB — BASIC METABOLIC PANEL
Anion gap: 10 (ref 5–15)
BUN: 18 mg/dL (ref 6–20)
CO2: 24 mmol/L (ref 22–32)
CREATININE: 1.49 mg/dL — AB (ref 0.44–1.00)
Calcium: 9.3 mg/dL (ref 8.9–10.3)
Chloride: 105 mmol/L (ref 101–111)
GFR calc Af Amer: 39 mL/min — ABNORMAL LOW (ref >60)
GFR, EST NON AFRICAN AMERICAN: 34 mL/min — AB (ref >60)
Glucose, Bld: 122 mg/dL — ABNORMAL HIGH (ref 65–99)
Potassium: 4.5 mmol/L (ref 3.5–5.1)
SODIUM: 139 mmol/L (ref 135–145)

## 2015-02-03 LAB — CBC
HCT: 27.4 % — ABNORMAL LOW (ref 35.0–47.0)
Hemoglobin: 8.8 g/dL — ABNORMAL LOW (ref 12.0–16.0)
MCH: 28.4 pg (ref 26.0–34.0)
MCHC: 32.2 g/dL (ref 32.0–36.0)
MCV: 88 fL (ref 80.0–100.0)
Platelets: 271 10*3/uL (ref 150–440)
RBC: 3.12 MIL/uL — AB (ref 3.80–5.20)
RDW: 15.8 % — ABNORMAL HIGH (ref 11.5–14.5)
WBC: 7.7 10*3/uL (ref 3.6–11.0)

## 2015-02-03 LAB — URINE CULTURE

## 2015-02-03 MED ORDER — METHYLPREDNISOLONE SODIUM SUCC 40 MG IJ SOLR
40.0000 mg | INTRAMUSCULAR | Status: DC
  Administered 2015-02-03: 40 mg via INTRAVENOUS
  Filled 2015-02-03: qty 1

## 2015-02-03 MED ORDER — AMOXICILLIN-POT CLAVULANATE 875-125 MG PO TABS: 1.0000 | ORAL_TABLET | Freq: Two times a day (BID) | ORAL | Status: DC

## 2015-02-03 MED ORDER — METOPROLOL TARTRATE 50 MG PO TABS: 50.0000 mg | ORAL_TABLET | Freq: Two times a day (BID) | ORAL | Status: DC

## 2015-02-03 MED ORDER — AMOXICILLIN-POT CLAVULANATE 875-125 MG PO TABS
1.0000 | ORAL_TABLET | Freq: Two times a day (BID) | ORAL | Status: DC
  Administered 2015-02-03: 1 via ORAL
  Filled 2015-02-03: qty 1

## 2015-02-03 MED ORDER — LOPERAMIDE HCL 2 MG PO CAPS
2.0000 mg | ORAL_CAPSULE | Freq: Four times a day (QID) | ORAL | Status: DC | PRN
  Administered 2015-02-03: 2 mg via ORAL
  Filled 2015-02-03: qty 1

## 2015-02-03 NOTE — Progress Notes (Signed)
Pt in NAD, skin warm and dry, respirations even and unlabored.  IV's and telemetry discontinued per policy and procedure.  Discharge instructions given to and reviewed with patients and patients husband.  Pt denies any pain or discomfort at this time.  Pt discharged home with Home Health to follow up.

## 2015-02-03 NOTE — Care Management Note (Signed)
Case Management Note  Patient Details  Name: Carolyn Lewis MRN: [id number] Date of Birth: 13-Sep-1940  Subjective/Objective:          Discharged home today with husband. Referral faxed and called to [facility], which Mr Meins chose from local providers, for home health PT, RN, Aid. Ms Hyman already has chronic home oxygen provided by [facility].               Expected Discharge Date:                  Expected Discharge Plan:     In-House Referral:     Discharge planning Services     Post Acute Care Choice:    Choice offered to:     DME Arranged:    DME Agency:     HH Arranged:    HH Agency:     Status of Service:     Medicare Important Message Given:  Yes Date Medicare IM Given:  02/02/15 Medicare IM give by:  Ermalene Searing Date Additional Medicare IM Given:    Additional Medicare Important Message give by:     If discussed at Long Length of Stay Meetings, dates discussed:    Additional Comments:  Tehran Rabenold A, RN 02/03/2015, 12:39 PM

## 2015-02-03 NOTE — Discharge Summary (Signed)
[facility] - [place] at [facility]   PATIENT NAME: Carolyn Lewis    MR#:  [id number]  DATE OF BIRTH:  Mar 27, 1941  DATE OF ADMISSION:  01/31/2015 ADMITTING PHYSICIAN: Adrian Saran, MD  DATE OF DISCHARGE: 02/03/2015 PRIMARY CARE PHYSICIAN: MASOUD,JAVED, MD    ADMISSION DIAGNOSIS:  UTI (lower urinary tract infection) [N39.0] Sepsis, due to unspecified organism [A41.9]  DISCHARGE DIAGNOSIS:  Active Problems:   Sepsis  Escherichia coli sepsis from urinary tract infection  SECONDARY DIAGNOSIS:   Past Medical History  Diagnosis Date  . COPD (chronic obstructive pulmonary disease)   . COPD (chronic obstructive pulmonary disease)   . Acute MI   . Anxiety   . Hypertension   . GERD (gastroesophageal reflux disease)   . CKD stage G3b/A3, GFR 30 - 44 and albumin creatinine ratio >300 mg/g    chronic respiratory failure on 2 L of oxygen  HOSPITAL COURSE:  This is a 74 year old female with a history of CAD, COPD with chronic oxygen at 2 L who presented with sepsis and now found to have gram-negative rod bacteremia.  1. Escherichia coli Sepsis from urinary tract infection: Patient was initially treated with Rocephin and then changed to Zosyn due to her blood and urine cultures reporting gram-negative rod. Her urine and blood cultures are positive for Escherichia coli which is pansensitive. She will be discharged with Augmentin. She will need 14 days total treatment. She will need a follow-up with her primary care physician in one week.   2. Escherichia coli urinary tract infection: Unfortunately she also had Escherichia coli bacteremia from the urine source as well. Plan as outlined above.  3. Chronic respiratory failure with 2 L of oxygen secondary to COPD: Patient does use 4 L of oxygen on exertion is according to her husband.Patient did have some wheezing on day 2 of hospitalization. Her chest x-ray did not show evidence of a pneumonia or CHF. Her wheezing is  improved. She was initially started on 1 dose of IV stairways. She does not need series at discharge.   4. CAD: Patient will continue her outpatient medications including Plavix, atorvastatin, metoprolol, nitroglycerin when necessary and Norvasc.   5. Hyponatremia: This seems to have resolved after IV fluids.  6. Sinus tachycardia: This is secondary sepsis.We increased her metoprolol to 50 mg by mouth every 12 hours. Her heart rate is better controlled. Her blood pressure can tolerate this increase in dose.  DISCHARGE CONDITIONS AND DIET:  Heart healthy  Patient is stable for discharge home with home health  CONSULTS OBTAINED:    None  DRUG ALLERGIES:   Allergies  Allergen Reactions  . Sulfa Antibiotics Rash    DISCHARGE MEDICATIONS:   Current Discharge Medication List    START taking these medications   Details  amoxicillin-clavulanate (AUGMENTIN) 875-125 MG per tablet Take 1 tablet by mouth every 12 (twelve) hours. Qty: 24 tablet, Refills: 0      CONTINUE these medications which have CHANGED   Details  metoprolol (LOPRESSOR) 50 MG tablet Take 1 tablet (50 mg total) by mouth 2 (two) times daily. Qty: 60 tablet, Refills: 0      CONTINUE these medications which have NOT CHANGED   Details  amitriptyline (ELAVIL) 25 MG tablet Take 25 mg by mouth at bedtime.    amLODipine (NORVASC) 5 MG tablet Take 5 mg by mouth daily.    atorvastatin (LIPITOR) 40 MG tablet Take 40 mg by mouth daily.    clopidogrel (PLAVIX) 75 MG tablet  Take 75 mg by mouth daily.    diazepam (VALIUM) 2 MG tablet Take 2 mg by mouth 2 (two) times daily.    enalapril (VASOTEC) 2.5 MG tablet Take 2.5 mg by mouth daily.    famotidine (PEPCID) 20 MG tablet Take 20 mg by mouth daily.    furosemide (LASIX) 20 MG tablet Take 20 mg by mouth once. Patient only takes Monday, Wednesday, and Friday.    Ipratropium-Albuterol (COMBIVENT RESPIMAT) 20-100 MCG/ACT AERS respimat Inhale 1 puff into the lungs 3 (three)  times daily as needed for wheezing or shortness of breath.    levalbuterol (XOPENEX) 1.25 MG/3ML nebulizer solution Take 1.25 mg by nebulization every 4 (four) hours as needed for wheezing or shortness of breath.    nitroGLYCERIN (NITROSTAT) 0.4 MG SL tablet Place 0.4 mg under the tongue every 5 (five) minutes as needed for chest pain.    pantoprazole (PROTONIX) 40 MG tablet Take 40 mg by mouth daily.    traMADol (ULTRAM) 50 MG tablet Take 50 mg by mouth 2 (two) times daily.              Today   CHIEF COMPLAINT:  Patient is doing well this morning. Patient does have some diarrhea P. Patient denies any abdominal pain or nausea. Patient is ready for discharge home.  VITAL SIGNS:  Blood pressure 138/62, pulse 104, temperature 98.2 F (36.8 C), temperature source Oral, resp. rate 18, height  (1.702 m), weight 74.39 kg (164 lb), SpO2 92 %.   REVIEW OF SYSTEMS:  Review of Systems  Constitutional: Negative for fever and chills.  Cardiovascular: Negative for chest pain and palpitations.  Gastrointestinal: Positive for diarrhea. Negative for nausea, vomiting and abdominal pain.  Genitourinary: Negative for dysuria, urgency and frequency.  Neurological: Negative for dizziness and tremors.     PHYSICAL EXAMINATION:  GENERAL:  74 y.o.-year-old patient lying in the bed with no acute distress.  NECK:  Supple, no jugular venous distention. No thyroid enlargement, no tenderness.  LUNGS: Normal breath sounds bilaterally, no wheezing, rales,rhonchi  No use of accessory muscles of respiration.  CARDIOVASCULAR: S1, S2 normal. No murmurs, rubs, or gallops.  ABDOMEN: Soft, non-tender, non-distended. Bowel sounds present. No organomegaly or mass.  EXTREMITIES: No pedal edema, cyanosis, or clubbing.  PSYCHIATRIC: The patient is alert and oriented x 3.  SKIN: No obvious rash, lesion, or ulcer.   DATA REVIEW:   CBC  Recent Labs Lab 02/03/15 0408  WBC 7.7  HGB 8.8*  HCT 27.4*   PLT 271    Chemistries   Recent Labs Lab 01/31/15 0949  02/03/15 0408  NA 131*  < > 139  K 3.9  < > 4.5  CL 98*  < > 105  CO2 20*  < > 24  GLUCOSE 116*  < > 122*  BUN 29*  < > 18  CREATININE 2.26*  < > 1.49*  CALCIUM 8.9  < > 9.3  AST 31  --   --   ALT 11*  --   --   ALKPHOS 172*  --   --   BILITOT 0.7  --   --   < > = values in this interval not displayed.  Cardiac Enzymes  Recent Labs Lab 01/31/15 0949  TROPONINI 0.05*    Microbiology Results  @  RADIOLOGY:  Dg Chest 1 View  02/02/2015   CLINICAL DATA:  Fever, chills, and progressive weakness.  EXAM: CHEST  1 VIEW  COMPARISON:  02/01/2015  FINDINGS:  There is hyperinflation and interstitial coarsening consistent with COPD. No definitive pneumonia, edema, effusion, or pneumothorax. Normal heart size and mediastinal contours.  IMPRESSION: COPD without acute superimposed finding.   Electronically Signed   By: Marnee Spring M.D.   On: 02/02/2015 10:11      Management plans discussed with the patient and she  is in agreement. Stable for discharge home with home health  Patient should follow up with PCP in 1 week  CODE STATUS:     Code Status Orders        Start     Ordered   01/31/15 1436  Full code   Continuous     01/31/15 1435      TOTAL TIME TAKING CARE OF THIS PATIENT: 35  minutes.    Llewyn Heap M.D on 02/03/2015 at 9:34 AM  Between 7am to 6pm - Pager - [phone] After 6pm go to [email or web address] - password [email or web address] [facility]  [place] [facility]  Office  [phone]  CC: Primary care physician; Corky Downs, MD

## 2015-02-04 DIAGNOSIS — I129 Hypertensive chronic kidney disease with stage 1 through stage 4 chronic kidney disease, or unspecified chronic kidney disease: Secondary | ICD-10-CM | POA: Diagnosis not present

## 2015-02-04 DIAGNOSIS — K219 Gastro-esophageal reflux disease without esophagitis: Secondary | ICD-10-CM | POA: Diagnosis not present

## 2015-02-04 DIAGNOSIS — J441 Chronic obstructive pulmonary disease with (acute) exacerbation: Secondary | ICD-10-CM | POA: Diagnosis not present

## 2015-02-04 DIAGNOSIS — I2510 Atherosclerotic heart disease of native coronary artery without angina pectoris: Secondary | ICD-10-CM | POA: Diagnosis not present

## 2015-02-04 DIAGNOSIS — F419 Anxiety disorder, unspecified: Secondary | ICD-10-CM | POA: Diagnosis not present

## 2015-02-04 DIAGNOSIS — N183 Chronic kidney disease, stage 3 (moderate): Secondary | ICD-10-CM | POA: Diagnosis not present

## 2015-02-04 DIAGNOSIS — I5030 Unspecified diastolic (congestive) heart failure: Secondary | ICD-10-CM | POA: Diagnosis not present

## 2015-02-04 LAB — CULTURE, BLOOD (ROUTINE X 2)

## 2015-02-05 DIAGNOSIS — I129 Hypertensive chronic kidney disease with stage 1 through stage 4 chronic kidney disease, or unspecified chronic kidney disease: Secondary | ICD-10-CM | POA: Diagnosis not present

## 2015-02-05 DIAGNOSIS — J441 Chronic obstructive pulmonary disease with (acute) exacerbation: Secondary | ICD-10-CM | POA: Diagnosis not present

## 2015-02-05 DIAGNOSIS — N183 Chronic kidney disease, stage 3 (moderate): Secondary | ICD-10-CM | POA: Diagnosis not present

## 2015-02-05 DIAGNOSIS — I2510 Atherosclerotic heart disease of native coronary artery without angina pectoris: Secondary | ICD-10-CM | POA: Diagnosis not present

## 2015-02-05 DIAGNOSIS — K219 Gastro-esophageal reflux disease without esophagitis: Secondary | ICD-10-CM | POA: Diagnosis not present

## 2015-02-05 DIAGNOSIS — I5030 Unspecified diastolic (congestive) heart failure: Secondary | ICD-10-CM | POA: Diagnosis not present

## 2015-02-05 DIAGNOSIS — F419 Anxiety disorder, unspecified: Secondary | ICD-10-CM | POA: Diagnosis not present

## 2015-02-05 LAB — CULTURE, BLOOD (ROUTINE X 2)

## 2015-02-06 ENCOUNTER — Inpatient Hospital Stay
Admit: 2015-02-06 | Discharge: 2015-02-06 | Disposition: A | Discharge status: Home / Self Care | Payer: Medicare Other | Attending: Internal Medicine | Admitting: Internal Medicine

## 2015-02-06 ENCOUNTER — Inpatient Hospital Stay
Admission: EM | Admit: 2015-02-06 | Discharge: 2015-02-07 | DRG: 191 | Disposition: A | Discharge status: Home / Self Care | Payer: Medicare Other | Attending: Internal Medicine | Admitting: Internal Medicine

## 2015-02-06 ENCOUNTER — Emergency Department: Payer: Medicare Other

## 2015-02-06 DIAGNOSIS — I129 Hypertensive chronic kidney disease with stage 1 through stage 4 chronic kidney disease, or unspecified chronic kidney disease: Secondary | ICD-10-CM | POA: Diagnosis present

## 2015-02-06 DIAGNOSIS — I5030 Unspecified diastolic (congestive) heart failure: Secondary | ICD-10-CM | POA: Diagnosis not present

## 2015-02-06 DIAGNOSIS — K219 Gastro-esophageal reflux disease without esophagitis: Secondary | ICD-10-CM | POA: Diagnosis present

## 2015-02-06 DIAGNOSIS — Z90710 Acquired absence of both cervix and uterus: Secondary | ICD-10-CM | POA: Diagnosis not present

## 2015-02-06 DIAGNOSIS — T50995A Adverse effect of other drugs, medicaments and biological substances, initial encounter: Secondary | ICD-10-CM | POA: Diagnosis present

## 2015-02-06 DIAGNOSIS — Z79899 Other long term (current) drug therapy: Secondary | ICD-10-CM | POA: Diagnosis not present

## 2015-02-06 DIAGNOSIS — Z9981 Dependence on supplemental oxygen: Secondary | ICD-10-CM | POA: Diagnosis not present

## 2015-02-06 DIAGNOSIS — J441 Chronic obstructive pulmonary disease with (acute) exacerbation: Secondary | ICD-10-CM | POA: Diagnosis not present

## 2015-02-06 DIAGNOSIS — I252 Old myocardial infarction: Secondary | ICD-10-CM | POA: Diagnosis not present

## 2015-02-06 DIAGNOSIS — N183 Chronic kidney disease, stage 3 (moderate): Secondary | ICD-10-CM | POA: Diagnosis present

## 2015-02-06 DIAGNOSIS — J189 Pneumonia, unspecified organism: Secondary | ICD-10-CM

## 2015-02-06 DIAGNOSIS — I2510 Atherosclerotic heart disease of native coronary artery without angina pectoris: Secondary | ICD-10-CM | POA: Diagnosis present

## 2015-02-06 DIAGNOSIS — A419 Sepsis, unspecified organism: Secondary | ICD-10-CM

## 2015-02-06 DIAGNOSIS — I5032 Chronic diastolic (congestive) heart failure: Secondary | ICD-10-CM | POA: Diagnosis present

## 2015-02-06 DIAGNOSIS — R05 Cough: Secondary | ICD-10-CM | POA: Diagnosis not present

## 2015-02-06 DIAGNOSIS — R509 Fever, unspecified: Secondary | ICD-10-CM | POA: Diagnosis not present

## 2015-02-06 DIAGNOSIS — J9610 Chronic respiratory failure, unspecified whether with hypoxia or hypercapnia: Secondary | ICD-10-CM | POA: Diagnosis present

## 2015-02-06 DIAGNOSIS — I10 Essential (primary) hypertension: Secondary | ICD-10-CM | POA: Diagnosis not present

## 2015-02-06 DIAGNOSIS — D72829 Elevated white blood cell count, unspecified: Secondary | ICD-10-CM | POA: Diagnosis present

## 2015-02-06 DIAGNOSIS — I509 Heart failure, unspecified: Secondary | ICD-10-CM | POA: Diagnosis not present

## 2015-02-06 DIAGNOSIS — I25119 Atherosclerotic heart disease of native coronary artery with unspecified angina pectoris: Secondary | ICD-10-CM | POA: Diagnosis not present

## 2015-02-06 LAB — EXPECTORATED SPUTUM ASSESSMENT W REFEX TO RESP CULTURE

## 2015-02-06 LAB — URINALYSIS COMPLETE WITH MICROSCOPIC (ARMC ONLY)
BACTERIA UA: NONE SEEN
BILIRUBIN URINE: NEGATIVE
GLUCOSE, UA: NEGATIVE mg/dL
KETONES UR: NEGATIVE mg/dL
Leukocytes, UA: NEGATIVE
Nitrite: NEGATIVE
PH: 5 (ref 5.0–8.0)
Protein, ur: NEGATIVE mg/dL
RBC / HPF: NONE SEEN RBC/hpf (ref 0–5)
Specific Gravity, Urine: 1.004 — ABNORMAL LOW (ref 1.005–1.030)
Squamous Epithelial / LPF: NONE SEEN

## 2015-02-06 LAB — LACTIC ACID, PLASMA
LACTIC ACID, VENOUS: 1.8 mmol/L (ref 0.5–2.0)
LACTIC ACID, VENOUS: 1.5 mmol/L (ref 0.5–2.0)

## 2015-02-06 LAB — CBC WITH DIFFERENTIAL/PLATELET
Basophils Absolute: 0.1 10*3/uL (ref 0–0.1)
Basophils Relative: 0 %
Eosinophils Absolute: 0.3 10*3/uL (ref 0–0.7)
Eosinophils Relative: 2 %
HCT: 32.7 % — ABNORMAL LOW (ref 35.0–47.0)
Hemoglobin: 10.2 g/dL — ABNORMAL LOW (ref 12.0–16.0)
LYMPHS PCT: 15 %
Lymphs Abs: 2.7 10*3/uL (ref 1.0–3.6)
MCH: 27.8 pg (ref 26.0–34.0)
MCHC: 31.3 g/dL — ABNORMAL LOW (ref 32.0–36.0)
MCV: 88.9 fL (ref 80.0–100.0)
MONO ABS: 1.1 10*3/uL — AB (ref 0.2–0.9)
Monocytes Relative: 6 %
Neutro Abs: 13.8 10*3/uL — ABNORMAL HIGH (ref 1.4–6.5)
Neutrophils Relative %: 77 %
Platelets: 395 10*3/uL (ref 150–440)
RBC: 3.68 MIL/uL — ABNORMAL LOW (ref 3.80–5.20)
RDW: 15.9 % — ABNORMAL HIGH (ref 11.5–14.5)
WBC: 18 10*3/uL — ABNORMAL HIGH (ref 3.6–11.0)

## 2015-02-06 LAB — LIPASE, BLOOD: Lipase: 47 U/L (ref 22–51)

## 2015-02-06 LAB — COMPREHENSIVE METABOLIC PANEL
ALK PHOS: 126 U/L (ref 38–126)
ALT: 17 U/L (ref 14–54)
AST: 31 U/L (ref 15–41)
Albumin: 3.6 g/dL (ref 3.5–5.0)
Anion gap: 12 (ref 5–15)
BUN: 19 mg/dL (ref 6–20)
CO2: 27 mmol/L (ref 22–32)
CREATININE: 1.71 mg/dL — AB (ref 0.44–1.00)
Calcium: 8.9 mg/dL (ref 8.9–10.3)
Chloride: 99 mmol/L — ABNORMAL LOW (ref 101–111)
GFR calc Af Amer: 33 mL/min — ABNORMAL LOW (ref >60)
GFR, EST NON AFRICAN AMERICAN: 29 mL/min — AB (ref >60)
Glucose, Bld: 104 mg/dL — ABNORMAL HIGH (ref 65–99)
Potassium: 3.8 mmol/L (ref 3.5–5.1)
Sodium: 138 mmol/L (ref 135–145)
Total Bilirubin: <0.1 mg/dL — ABNORMAL LOW (ref 0.3–1.2)
Total Protein: 7.7 g/dL (ref 6.5–8.1)

## 2015-02-06 LAB — C DIFFICILE QUICK SCREEN W PCR REFLEX
C DIFFICILE (CDIFF) TOXIN: NEGATIVE
C Diff antigen: NEGATIVE
C Diff interpretation: NEGATIVE

## 2015-02-06 LAB — PROTIME-INR
INR: 0.93
PROTHROMBIN TIME: 12.7 s (ref 11.4–15.0)

## 2015-02-06 LAB — TROPONIN I: Troponin I: 0.27 ng/mL — ABNORMAL HIGH (ref <0.031)

## 2015-02-06 LAB — APTT: aPTT: 29 seconds (ref 24–36)

## 2015-02-06 LAB — GLUCOSE, RANDOM: Glucose, Bld: 152 mg/dL — ABNORMAL HIGH (ref 65–99)

## 2015-02-06 MED ORDER — TOBRAMYCIN 0.3 % OP SOLN
1.0000 [drp] | Freq: Two times a day (BID) | OPHTHALMIC | Status: DC
  Administered 2015-02-06 – 2015-02-07 (×2): 1 [drp] via OPHTHALMIC
  Filled 2015-02-06: qty 5

## 2015-02-06 MED ORDER — AMITRIPTYLINE HCL 25 MG PO TABS
25.0000 mg | ORAL_TABLET | Freq: Every day | ORAL | Status: DC
  Administered 2015-02-06: 25 mg via ORAL
  Filled 2015-02-06: qty 1

## 2015-02-06 MED ORDER — ACETAMINOPHEN 500 MG PO TABS
1000.0000 mg | ORAL_TABLET | Freq: Once | ORAL | Status: AC
  Administered 2015-02-06: 1000 mg via ORAL

## 2015-02-06 MED ORDER — PIPERACILLIN-TAZOBACTAM 3.375 G IVPB 30 MIN
3.3750 g | Freq: Three times a day (TID) | INTRAVENOUS | Status: DC
  Administered 2015-02-06: 3.375 g via INTRAVENOUS
  Filled 2015-02-06 (×4): qty 50

## 2015-02-06 MED ORDER — FUROSEMIDE 20 MG PO TABS
20.0000 mg | ORAL_TABLET | Freq: Once | ORAL | Status: AC
  Administered 2015-02-06: 20 mg via ORAL
  Filled 2015-02-06: qty 1

## 2015-02-06 MED ORDER — SODIUM CHLORIDE 0.9 % IV BOLUS (SEPSIS)
1000.0000 mL | INTRAVENOUS | Status: AC
  Administered 2015-02-06 (×2): 1000 mL via INTRAVENOUS

## 2015-02-06 MED ORDER — AMLODIPINE BESYLATE 5 MG PO TABS
5.0000 mg | ORAL_TABLET | Freq: Every day | ORAL | Status: DC
  Administered 2015-02-06 – 2015-02-07 (×2): 5 mg via ORAL
  Filled 2015-02-06 (×2): qty 1

## 2015-02-06 MED ORDER — SODIUM CHLORIDE 0.9 % IJ SOLN
3.0000 mL | Freq: Two times a day (BID) | INTRAMUSCULAR | Status: DC
  Administered 2015-02-06: 3 mL via INTRAVENOUS

## 2015-02-06 MED ORDER — METOPROLOL TARTRATE 50 MG PO TABS
50.0000 mg | ORAL_TABLET | Freq: Two times a day (BID) | ORAL | Status: DC
  Administered 2015-02-06 – 2015-02-07 (×2): 50 mg via ORAL
  Filled 2015-02-06 (×2): qty 1

## 2015-02-06 MED ORDER — FUROSEMIDE 10 MG/ML IJ SOLN
INTRAMUSCULAR | Status: AC
  Filled 2015-02-06: qty 10

## 2015-02-06 MED ORDER — ACETAMINOPHEN 500 MG PO TABS
ORAL_TABLET | ORAL | Status: AC
  Administered 2015-02-06: 1000 mg via ORAL
  Filled 2015-02-06: qty 2

## 2015-02-06 MED ORDER — ONDANSETRON HCL 4 MG PO TABS: 4.0000 mg | ORAL_TABLET | Freq: Four times a day (QID) | ORAL | Status: DC | PRN

## 2015-02-06 MED ORDER — ENALAPRIL MALEATE 5 MG PO TABS
2.5000 mg | ORAL_TABLET | Freq: Every day | ORAL | Status: DC
  Administered 2015-02-06 – 2015-02-07 (×2): 2.5 mg via ORAL
  Filled 2015-02-06 (×2): qty 1

## 2015-02-06 MED ORDER — TRAMADOL HCL 50 MG PO TABS
50.0000 mg | ORAL_TABLET | Freq: Two times a day (BID) | ORAL | Status: DC
  Administered 2015-02-06 – 2015-02-07 (×3): 50 mg via ORAL
  Filled 2015-02-06 (×3): qty 1

## 2015-02-06 MED ORDER — RANOLAZINE ER 500 MG PO TB12
500.0000 mg | ORAL_TABLET | Freq: Two times a day (BID) | ORAL | Status: DC
  Administered 2015-02-06 – 2015-02-07 (×3): 500 mg via ORAL
  Filled 2015-02-06 (×3): qty 1

## 2015-02-06 MED ORDER — SODIUM CHLORIDE 0.9 % IV SOLN
250.0000 mL | INTRAVENOUS | Status: DC | PRN
Start: 2015-02-06 — End: 2015-02-06

## 2015-02-06 MED ORDER — HYDROCODONE-ACETAMINOPHEN 5-325 MG PO TABS
1.0000 | ORAL_TABLET | ORAL | Status: DC | PRN
  Administered 2015-02-06: 1 via ORAL
  Administered 2015-02-07: 2 via ORAL
  Filled 2015-02-06: qty 2
  Filled 2015-02-06: qty 1

## 2015-02-06 MED ORDER — IPRATROPIUM-ALBUTEROL 0.5-2.5 (3) MG/3ML IN SOLN
3.0000 mL | Freq: Once | RESPIRATORY_TRACT | Status: AC
  Administered 2015-02-06: 3 mL via RESPIRATORY_TRACT

## 2015-02-06 MED ORDER — LEVALBUTEROL HCL 1.25 MG/3ML IN NEBU
1.2500 mg | INHALATION_SOLUTION | RESPIRATORY_TRACT | Status: DC | PRN
Start: 2015-02-06 — End: 2015-02-07
  Administered 2015-02-06: 1.25 mg via RESPIRATORY_TRACT
  Filled 2015-02-06 (×2): qty 3
  Filled 2015-02-06: qty 0.5

## 2015-02-06 MED ORDER — DIAZEPAM 2 MG PO TABS
2.0000 mg | ORAL_TABLET | Freq: Two times a day (BID) | ORAL | Status: DC
  Administered 2015-02-06 – 2015-02-07 (×3): 2 mg via ORAL
  Filled 2015-02-06 (×3): qty 1

## 2015-02-06 MED ORDER — PIPERACILLIN-TAZOBACTAM 3.375 G IVPB
INTRAVENOUS | Status: AC
  Administered 2015-02-06: 3.375 g via INTRAVENOUS
  Filled 2015-02-06: qty 50

## 2015-02-06 MED ORDER — METHYLPREDNISOLONE SODIUM SUCC 125 MG IJ SOLR
60.0000 mg | Freq: Two times a day (BID) | INTRAMUSCULAR | Status: DC
  Administered 2015-02-06 – 2015-02-07 (×3): 60 mg via INTRAVENOUS
  Filled 2015-02-06 (×2): qty 2

## 2015-02-06 MED ORDER — ACETAMINOPHEN 325 MG PO TABS: 650.0000 mg | ORAL_TABLET | Freq: Four times a day (QID) | ORAL | Status: DC | PRN

## 2015-02-06 MED ORDER — NITROGLYCERIN 0.4 MG SL SUBL
SUBLINGUAL_TABLET | SUBLINGUAL | Status: AC
  Filled 2015-02-06: qty 1

## 2015-02-06 MED ORDER — IPRATROPIUM-ALBUTEROL 0.5-2.5 (3) MG/3ML IN SOLN
RESPIRATORY_TRACT | Status: AC
  Administered 2015-02-06: 3 mL via RESPIRATORY_TRACT
  Filled 2015-02-06: qty 3

## 2015-02-06 MED ORDER — ACETAMINOPHEN 650 MG RE SUPP: 650.0000 mg | Freq: Four times a day (QID) | RECTAL | Status: DC | PRN

## 2015-02-06 MED ORDER — SODIUM CHLORIDE 0.9 % IJ SOLN: 3.0000 mL | INTRAMUSCULAR | Status: DC | PRN

## 2015-02-06 MED ORDER — SENNOSIDES-DOCUSATE SODIUM 8.6-50 MG PO TABS: 1.0000 | ORAL_TABLET | Freq: Every evening | ORAL | Status: DC | PRN

## 2015-02-06 MED ORDER — PANTOPRAZOLE SODIUM 40 MG PO TBEC
40.0000 mg | DELAYED_RELEASE_TABLET | Freq: Every day | ORAL | Status: DC
  Administered 2015-02-06 – 2015-02-07 (×2): 40 mg via ORAL
  Filled 2015-02-06 (×2): qty 1

## 2015-02-06 MED ORDER — VANCOMYCIN HCL IN DEXTROSE 1-5 GM/200ML-% IV SOLN
INTRAVENOUS | Status: AC
  Administered 2015-02-06: 1000 mg via INTRAVENOUS
  Filled 2015-02-06: qty 200

## 2015-02-06 MED ORDER — METHYLPREDNISOLONE SODIUM SUCC 125 MG IJ SOLR
INTRAMUSCULAR | Status: AC
  Administered 2015-02-06: 60 mg via INTRAVENOUS
  Filled 2015-02-06: qty 2

## 2015-02-06 MED ORDER — VANCOMYCIN HCL IN DEXTROSE 1-5 GM/200ML-% IV SOLN
1000.0000 mg | Freq: Once | INTRAVENOUS | Status: AC
  Administered 2015-02-06: 1000 mg via INTRAVENOUS

## 2015-02-06 MED ORDER — NITROGLYCERIN 0.4 MG SL SUBL: 0.4000 mg | SUBLINGUAL_TABLET | SUBLINGUAL | Status: DC | PRN

## 2015-02-06 MED ORDER — CLOPIDOGREL BISULFATE 75 MG PO TABS
75.0000 mg | ORAL_TABLET | Freq: Every day | ORAL | Status: DC
  Administered 2015-02-06 – 2015-02-07 (×2): 75 mg via ORAL
  Filled 2015-02-06 (×2): qty 1

## 2015-02-06 MED ORDER — FAMOTIDINE 20 MG PO TABS
20.0000 mg | ORAL_TABLET | Freq: Every day | ORAL | Status: DC
  Administered 2015-02-06 – 2015-02-07 (×2): 20 mg via ORAL
  Filled 2015-02-06 (×2): qty 1

## 2015-02-06 MED ORDER — IPRATROPIUM-ALBUTEROL 0.5-2.5 (3) MG/3ML IN SOLN: 3.0000 mL | Freq: Three times a day (TID) | RESPIRATORY_TRACT | Status: DC | PRN

## 2015-02-06 MED ORDER — ALUM & MAG HYDROXIDE-SIMETH 200-200-20 MG/5ML PO SUSP: 30.0000 mL | Freq: Four times a day (QID) | ORAL | Status: DC | PRN

## 2015-02-06 MED ORDER — ONDANSETRON HCL 4 MG/2ML IJ SOLN: 4.0000 mg | Freq: Four times a day (QID) | INTRAMUSCULAR | Status: DC | PRN

## 2015-02-06 MED ORDER — ATORVASTATIN CALCIUM 20 MG PO TABS
40.0000 mg | ORAL_TABLET | Freq: Every day | ORAL | Status: DC
Start: 2015-02-06 — End: 2015-02-07
  Administered 2015-02-06 – 2015-02-07 (×2): 40 mg via ORAL
  Filled 2015-02-06 (×3): qty 2

## 2015-02-06 MED ORDER — ENOXAPARIN SODIUM 30 MG/0.3ML ~~LOC~~ SOLN
30.0000 mg | SUBCUTANEOUS | Status: DC
  Administered 2015-02-06: 30 mg via SUBCUTANEOUS

## 2015-02-06 MED ORDER — PIPERACILLIN-TAZOBACTAM 3.375 G IVPB
3.3750 g | Freq: Three times a day (TID) | INTRAVENOUS | Status: DC
  Administered 2015-02-06 – 2015-02-07 (×3): 3.375 g via INTRAVENOUS
  Filled 2015-02-06 (×8): qty 50

## 2015-02-06 NOTE — ED Notes (Signed)
Patient presents with reports of fever > 100. Patient d/c'd from here on Saturday with "infection in blood" and was told to return if temp elevated > 100. (+) productive cough; green sputum production.

## 2015-02-06 NOTE — Care Management (Signed)
Patient is readmit.  Notified [facility] of readmission

## 2015-02-06 NOTE — H&P (Signed)
[facility] - [place] at [facility]   PATIENT NAME: Carolyn Lewis    MR#:  [id number]  DATE OF BIRTH:  Sep 08, 1940  DATE OF ADMISSION:  02/06/2015  PRIMARY CARE PHYSICIAN: Corky Downs, MD   REQUESTING/REFERRING PHYSICIAN: Dr. Scotty Court  CHIEF COMPLAINT:   Fever of 100.4 HISTORY OF PRESENT ILLNESS:  Carolyn Lewis  is a 74 y.o. female with a known history of COPD on 2 L of oxygen, CAD, chronic kidney disease age 74 and recent admission for Escherichia coli bacteremia who presented to the emergency room with above complaint. Patient was well when she was discharged. However her husband took her temperature this morning as per the discharge instructions. Her temperature was 100.4 so she was brought to the ER for further evaluation. In the emergency department she was started on broad-spectrum antibodies. Her chest x-ray showed no evidence of acute infiltrate. She does have wheezing on examination.  PAST MEDICAL HISTORY:   Past Medical History  Diagnosis Date  . COPD (chronic obstructive pulmonary disease)   . COPD (chronic obstructive pulmonary disease)   . Acute MI   . Anxiety   . Hypertension   . GERD (gastroesophageal reflux disease)   . CKD stage G3b/A3, GFR 30 - 44 and albumin creatinine ratio >300 mg/g     PAST SURGICAL HISTORY:   Past Surgical History  Procedure Laterality Date  . Abdominal hysterectomy    . Cardiac catheterization  2011    SOCIAL HISTORY:   History  Substance Use Topics  . Smoking status: Never Smoker   . Smokeless tobacco: Not on file  . Alcohol Use: No    FAMILY HISTORY:  No family history on file.  DRUG ALLERGIES:   Allergies  Allergen Reactions  . Sulfa Antibiotics Rash     REVIEW OF SYSTEMS:  CONSTITUTIONAL: Fever of 100.4 home and 99 in the emergency room, ositos generalized weakness.  EYES: No blurred or double vision.  EARS, NOSE, AND THROAT: No tinnitus or ear pain.  RESPIRATORY: Positive cough and shortness  of breath, wheezing no hemoptysis.  CARDIOVASCULAR: No chest pain, orthopnea, edema.  GASTROINTESTINAL: No nausea, vomiting, diarrhea or abdominal pain.  GENITOURINARY: No dysuria, hematuria.  ENDOCRINE: No polyuria, nocturia,  HEMATOLOGY: No anemia, easy bruising or bleeding SKIN: No rash or lesion. MUSCULOSKELETAL: No joint pain positive arthritis.   NEUROLOGIC: No tingling, numbness, weakness.  PSYCHIATRY:  No anxiety  MEDICATIONS AT HOME:   Prior to Admission medications   Medication Sig Start Date End Date Taking? Authorizing Provider  amitriptyline (ELAVIL) 25 MG tablet Take 25 mg by mouth at bedtime.   Yes Historical Provider, MD  amLODipine (NORVASC) 5 MG tablet Take 5 mg by mouth daily.   Yes Historical Provider, MD  amoxicillin-clavulanate (AUGMENTIN) 875-125 MG per tablet Take 1 tablet by mouth every 12 (twelve) hours. 02/03/15  Yes Adrian Saran, MD  atorvastatin (LIPITOR) 40 MG tablet Take 40 mg by mouth daily.   Yes Historical Provider, MD  clopidogrel (PLAVIX) 75 MG tablet Take 75 mg by mouth daily.   Yes Historical Provider, MD  diazepam (VALIUM) 2 MG tablet Take 2 mg by mouth 2 (two) times daily.   Yes Historical Provider, MD  enalapril (VASOTEC) 2.5 MG tablet Take 2.5 mg by mouth daily.   Yes Historical Provider, MD  famotidine (PEPCID) 20 MG tablet Take 20 mg by mouth daily.   Yes Historical Provider, MD  furosemide (LASIX) 20 MG tablet Take 20 mg by mouth once. Patient only  takes Monday, Wednesday, and Friday.   Yes Historical Provider, MD  Ipratropium-Albuterol (COMBIVENT RESPIMAT) 20-100 MCG/ACT AERS respimat Inhale 1 puff into the lungs 3 (three) times daily as needed for wheezing or shortness of breath.   Yes Historical Provider, MD  levalbuterol (XOPENEX) 1.25 MG/3ML nebulizer solution Take 1.25 mg by nebulization every 4 (four) hours as needed for wheezing or shortness of breath.   Yes Historical Provider, MD  metoprolol (LOPRESSOR) 50 MG tablet Take 1 tablet (50 mg  total) by mouth 2 (two) times daily. 02/03/15  Yes Adrian Saran, MD  nitroGLYCERIN (NITROSTAT) 0.4 MG SL tablet Place 0.4 mg under the tongue every 5 (five) minutes as needed for chest pain.   Yes Historical Provider, MD  pantoprazole (PROTONIX) 40 MG tablet Take 40 mg by mouth daily.   Yes Historical Provider, MD  RANEXA 500 MG 12 hr tablet Take 500 mg by mouth 2 (two) times daily.  01/19/15  Yes Historical Provider, MD  traMADol (ULTRAM) 50 MG tablet Take 50 mg by mouth 2 (two) times daily.   Yes Historical Provider, MD  SUMAtriptan (IMITREX) 25 MG tablet Take 25 mg by mouth every 2 (two) hours as needed.  12/31/14   Historical Provider, MD  tobramycin (TOBREX) 0.3 % ophthalmic solution 1 drop 2 (two) times daily. 1 drop into affected eye twice daily 01/19/15   Historical Provider, MD      VITAL SIGNS:  Blood pressure 130/86, pulse 105, temperature 98.5 F (36.9 C), temperature source Oral, resp. rate 18, height 5\' 7"  (1.702 m), weight 65.772 kg (145 lb), SpO2 99 %.  PHYSICAL EXAMINATION:  GENERAL:  74 y.o.-year-old patient lying in the bed with no acute distress.  EYES: Pupils equal, round, reactive to light and accommodation. No scleral icterus. Extraocular muscles intact.  HEENT: Head atraumatic, normocephalic. Oropharynx and nasopharynx clear.  NECK:  Supple, no jugular venous distention. No thyroid enlargement, no tenderness.  LUNGS: She has bilateral wheezing. Normal respiratory effort. No crackles or rales are heard. CARDIOVASCULAR: 2/6 systolic ejection murmur. No gallop or rub. Tachycardia. ABDOMEN: Soft, nontender, nondistended. Bowel sounds present. No organomegaly or mass.  EXTREMITIES: No pedal edema, cyanosis, or clubbing.  NEUROLOGIC: Cranial nerves II through XII are intact. Muscle strength 4/5 in all extremities. Sensation intact. Gait not checked.  PSYCHIATRIC: The patient is alert and oriented x 3.  SKIN: No obvious rash, lesion, or ulcer.   LABORATORY PANEL:    CBC  Recent Labs Lab 02/06/15 0743  WBC 18.0*  HGB 10.2*  HCT 32.7*  PLT 395   ------------------------------------------------------------------------------------------------------------------  Chemistries   Recent Labs Lab 02/06/15 0743  NA 138  K 3.8  CL 99*  CO2 27  GLUCOSE 104*  BUN 19  CREATININE 1.71*  CALCIUM 8.9  AST 31  ALT 17  ALKPHOS 126  BILITOT <0.1*   ------------------------------------------------------------------------------------------------------------------  Cardiac Enzymes  Recent Labs Lab 01/31/15 0949 02/06/15 0743  TROPONINI 0.05* 0.27*   ------------------------------------------------------------------------------------------------------------------  RADIOLOGY:  Dg Chest Port 1 View  02/06/2015   IPRESSION: Emphysema.  No acute infiltrates.   Electronically Signed   By: Francene Boyers M.D.   On: 02/06/2015 07:50    EKG:   Orders placed or performed during the hospital encounter of 02/06/15  . EKG 12-Lead  . EKG 12-Lead   EKG no SL elevation or depression. Her heart rate is 112.  IMPRESSION AND PLAN:  This is a very pleasant 74 year old female with a history of COPD and 2 L of oxygen  with recent admission to the hospital for Escherichia coli sepsis secondary to Escherichia coli unit tract infection who presents with a temperature of 100.4 home as well as shortness of breath and wheezing.   1. Fever: Patient is afebrile here in the emergency room. Patient was on Augmentin per her sensitivities for her recent Escherichia coli bacteremia. Her chest x-ray does not show any acute infiltrate. She does state that she has a cough and shortness of breath. She may have an underlying healthcare acquired pneumonia. Patient is on broad-spectrum and a biotics for now. I will repeat a chest x-ray in a.m. to evaluate. If there is no infiltrate seen on the chest x-ray and blood cultures remain negative I will taper her antibiotics to the  Escherichia coli that was present during her last hospitalization 2. COPD exacerbation with chronic respiratory failure on 2 L oxygen: Patient does have bilateral wheezing. I have started IV steroids and we'll continue to monitor.  3. Leukocytosis: I suspect this is related to steroids that I prescribed to her during the last auscultation. I cannot completely rule out a hospital acquired pneumonia and her white blood cell count could be from an infection. I will repeat a CBC in a.m.  4. Chronic kidney disease stage III: Patient's creatinine remained stable. Her baseline crit any is 1.6-2.0.  5. CAD: Patient has a stent as well. Patient will continue her outpatient medications including Plavix, so atorvastatin, metoprolol and nitroglycerin when necessary.  All the records are reviewed and case discussed with ED provider. Management plans discussed with the patient and she  is in agreement.  CODE STATUS: Full  TOTAL TIME TAKING CARE OF THIS PATIENT: 45 minutes.  > 50 % coordination in care   Saliou Barnier M.D on 02/06/2015 at 12:00 PM  Between 7am to 6pm - Pager - [phone] After 6pm go to [email or web address] - password [email or web address]  [facility]  Office  [phone]  CC: Primary care physician; Corky Downs, MD

## 2015-02-06 NOTE — Progress Notes (Signed)
ANTIBIOTIC CONSULT NOTE - INITIAL  Pharmacy Consult for Zosyn Indication: rule out pneumonia/?UTI  Allergies  Allergen Reactions  . Sulfa Antibiotics Rash    Patient Measurements: Height: 5\' 7"  (170.2 cm) Weight: 145 lb (65.772 kg) IBW/kg (Calculated) : 61.6 Adjusted Body Weight: 63 kg  Vital Signs: Temp: 98.5 F (36.9 C) (06/14 1123) Temp Source: Oral (06/14 1123) BP: 130/86 mmHg (06/14 1123) Pulse Rate: 105 (06/14 1123) Intake/Output from previous day:   Intake/Output from this shift:    Labs:  Recent Labs  02/06/15 0743  WBC 18.0*  HGB 10.2*  PLT 395  CREATININE 1.71*   Estimated Creatinine Clearance: 28.9 mL/min (by C-G formula based on Cr of 1.71).  Microbiology: Urinalysis/Urine Cx ordered.  Medical History: Past Medical History  Diagnosis Date  . COPD (chronic obstructive pulmonary disease)   . COPD (chronic obstructive pulmonary disease)   . Acute MI   . Anxiety   . Hypertension   . GERD (gastroesophageal reflux disease)   . CKD stage G3b/A3, GFR 30 - 44 and albumin creatinine ratio >300 mg/g     Medications:  Scheduled:  . furosemide      . methylPREDNISolone (SOLU-MEDROL) injection  60 mg Intravenous Q12H  . nitroGLYCERIN       Anti-infectives    Start     Dose/Rate Route Frequency Ordered Stop   02/06/15 0745  vancomycin (VANCOCIN) IVPB 1000 mg/200 mL premix     1,000 mg 200 mL/hr over 60 Minutes Intravenous  Once 02/06/15 0737 02/06/15 1058   02/06/15 0745  piperacillin-tazobactam (ZOSYN) IVPB 3.375 g  Status:  Discontinued     3.375 g 100 mL/hr over 30 Minutes Intravenous 3 times per day 02/06/15 0737 02/06/15 1214     Assessment: 74 yo female admitted with Fever,?COPD eacerbation, r/o ?HCAP per MD note. Patient with recent admit for E.coli bacteremia secondary to E.coli UTI.  CXR negative for acute infiltrates. UA/Urine cx ordered. Patient has received Vancomycin and Zosyn x 1 in ER.  Goal of Therapy:  Resolution of  fever/infection  Plan:  Per discussion with MD; discontinue Vancomycin at this time. F/U CXR/CBC in am. Will continue with Zosyn EI 3.375gm IV Q8h. Follow up culture results  Bari Mantis PharmD Clinical Pharmacist 02/06/2015

## 2015-02-06 NOTE — ED Notes (Signed)
Call to [facility] on 2A, gave report.

## 2015-02-06 NOTE — Progress Notes (Signed)
ANTICOAGULATION CONSULT NOTE - Initial Consult  Pharmacy Consult for Lovenox Indication: VTE prophylaxis  Allergies  Allergen Reactions  . Sulfa Antibiotics Rash    Patient Measurements: Height: 5\' 7"  (170.2 cm) Weight: 145 lb (65.772 kg) IBW/kg (Calculated) : 61.6 Heparin Dosing Weight:   Vital Signs: Temp: 98.5 F (36.9 C) (06/14 1123) Temp Source: Oral (06/14 1123) BP: 130/86 mmHg (06/14 1123) Pulse Rate: 105 (06/14 1123)  Labs:  Recent Labs  02/06/15 0743  HGB 10.2*  HCT 32.7*  PLT 395  APTT 29  LABPROT 12.7  INR 0.93  CREATININE 1.71*  TROPONINI 0.27*    Estimated Creatinine Clearance: 28.9 mL/min (by C-G formula based on Cr of 1.71).   Medical History: Past Medical History  Diagnosis Date  . COPD (chronic obstructive pulmonary disease)   . COPD (chronic obstructive pulmonary disease)   . Acute MI   . Anxiety   . Hypertension   . GERD (gastroesophageal reflux disease)   . CKD stage G3b/A3, GFR 30 - 44 and albumin creatinine ratio >300 mg/g     Medications:  Prescriptions prior to admission  Medication Sig Dispense Refill Last Dose  . amitriptyline (ELAVIL) 25 MG tablet Take 25 mg by mouth at bedtime.   02/05/2015 at Unknown time  . amLODipine (NORVASC) 5 MG tablet Take 5 mg by mouth daily.   02/05/2015 at Unknown time  . amoxicillin-clavulanate (AUGMENTIN) 875-125 MG per tablet Take 1 tablet by mouth every 12 (twelve) hours. 24 tablet 0 02/05/2015 at Unknown time  . atorvastatin (LIPITOR) 40 MG tablet Take 40 mg by mouth daily.   02/05/2015 at Unknown time  . clopidogrel (PLAVIX) 75 MG tablet Take 75 mg by mouth daily.   02/05/2015 at Unknown time  . diazepam (VALIUM) 2 MG tablet Take 2 mg by mouth 2 (two) times daily.   02/05/2015 at Unknown time  . enalapril (VASOTEC) 2.5 MG tablet Take 2.5 mg by mouth daily.   02/05/2015 at Unknown time  . famotidine (PEPCID) 20 MG tablet Take 20 mg by mouth daily.   02/05/2015 at Unknown time  . furosemide (LASIX) 20  MG tablet Take 20 mg by mouth once. Patient only takes Monday, Wednesday, and Friday.   02/05/2015 at Unknown time  . Ipratropium-Albuterol (COMBIVENT RESPIMAT) 20-100 MCG/ACT AERS respimat Inhale 1 puff into the lungs 3 (three) times daily as needed for wheezing or shortness of breath.   02/05/2015 at Unknown time  . levalbuterol (XOPENEX) 1.25 MG/3ML nebulizer solution Take 1.25 mg by nebulization every 4 (four) hours as needed for wheezing or shortness of breath.   02/05/2015 at Unknown time  . metoprolol (LOPRESSOR) 50 MG tablet Take 1 tablet (50 mg total) by mouth 2 (two) times daily. 60 tablet 0 02/05/2015 at Unknown time  . nitroGLYCERIN (NITROSTAT) 0.4 MG SL tablet Place 0.4 mg under the tongue every 5 (five) minutes as needed for chest pain.   02/05/2015 at Unknown time  . pantoprazole (PROTONIX) 40 MG tablet Take 40 mg by mouth daily.   02/05/2015 at Unknown time  . RANEXA 500 MG 12 hr tablet Take 500 mg by mouth 2 (two) times daily.    02/05/2015 at Unknown time  . traMADol (ULTRAM) 50 MG tablet Take 50 mg by mouth 2 (two) times daily.   02/05/2015 at Unknown time  . SUMAtriptan (IMITREX) 25 MG tablet Take 25 mg by mouth every 2 (two) hours as needed.    unknown at unknown  . tobramycin (TOBREX) 0.3 %  ophthalmic solution 1 drop 2 (two) times daily. 1 drop into affected eye twice daily   unknown at unknwn    Assessment: CrCl = 28.9 ml/min  Goal of Therapy:  DVT prophylaxis    Plan:  Lovenox 40 mg SQ Q24H originally ordered.  Will adjust dose to Lovenox 30 mg SQ Q24H based on CrCl < 30 ml/min.   Jaylon Boylen D 02/06/2015,3:50 PM

## 2015-02-06 NOTE — Progress Notes (Signed)
*  PRELIMINARY RESULTS* Echocardiogram 2D Echocardiogram has been performed.  Carolyn Lewis 02/06/2015, 4:50 PM

## 2015-02-06 NOTE — Progress Notes (Signed)
[facility]  Patient Status: Active (receiving services up to time of hospitalization)  [facility] is providing the following services: RN and PT  If patient discharges after hours, please call [phone].   Carolyn Lewis 02/06/2015, 11:04 AM

## 2015-02-06 NOTE — ED Provider Notes (Signed)
[facility] Emergency Department Provider Note  ____________________________________________  Time seen: 7:25 AM  I have reviewed the triage vital signs and the nursing notes.   HISTORY  Chief Complaint Fever    HPI Carolyn Lewis is a 74 y.o. female who presents with fever. The patient was recently admitted to the hospital due to a urinary tract infection with sepsis. She was discharged home a few days ago with instructions to return to the Hospital if she developed any fever. This morning she had a temperature of 100.4 at home which the patient verified by checking a second time. She has also noted some productive cough over the last 24 hours and shortness of breath. No chest pain abdominal pain back pain. No vomiting or diarrhea     Past Medical History  Diagnosis Date  . COPD (chronic obstructive pulmonary disease)   . COPD (chronic obstructive pulmonary disease)   . Acute MI   . Anxiety   . Hypertension   . GERD (gastroesophageal reflux disease)   . CKD stage G3b/A3, GFR 30 - 44 and albumin creatinine ratio >300 mg/g     Patient Active Problem List   Diagnosis Date Noted  . Sepsis 01/31/2015    Past Surgical History  Procedure Laterality Date  . Abdominal hysterectomy    . Cardiac catheterization  2011    Current Outpatient Rx  Name  Route  Sig  Dispense  Refill  . amitriptyline (ELAVIL) 25 MG tablet   Oral   Take 25 mg by mouth at bedtime.         Marland Kitchen amLODipine (NORVASC) 5 MG tablet   Oral   Take 5 mg by mouth daily.         Marland Kitchen amoxicillin-clavulanate (AUGMENTIN) 875-125 MG per tablet   Oral   Take 1 tablet by mouth every 12 (twelve) hours.   24 tablet   0   . atorvastatin (LIPITOR) 40 MG tablet   Oral   Take 40 mg by mouth daily.         . clopidogrel (PLAVIX) 75 MG tablet   Oral   Take 75 mg by mouth daily.         . diazepam (VALIUM) 2 MG tablet   Oral   Take 2 mg by mouth 2 (two) times daily.         .  enalapril (VASOTEC) 2.5 MG tablet   Oral   Take 2.5 mg by mouth daily.         . famotidine (PEPCID) 20 MG tablet   Oral   Take 20 mg by mouth daily.         . furosemide (LASIX) 20 MG tablet   Oral   Take 20 mg by mouth once. Patient only takes Monday, Wednesday, and Friday.         . Ipratropium-Albuterol (COMBIVENT RESPIMAT) 20-100 MCG/ACT AERS respimat   Inhalation   Inhale 1 puff into the lungs 3 (three) times daily as needed for wheezing or shortness of breath.         . levalbuterol (XOPENEX) 1.25 MG/3ML nebulizer solution   Nebulization   Take 1.25 mg by nebulization every 4 (four) hours as needed for wheezing or shortness of breath.         . metoprolol (LOPRESSOR) 50 MG tablet   Oral   Take 1 tablet (50 mg total) by mouth 2 (two) times daily.   60 tablet   0   .  nitroGLYCERIN (NITROSTAT) 0.4 MG SL tablet   Sublingual   Place 0.4 mg under the tongue every 5 (five) minutes as needed for chest pain.         . pantoprazole (PROTONIX) 40 MG tablet   Oral   Take 40 mg by mouth daily.         Marland Kitchen RANEXA 500 MG 12 hr tablet   Oral   Take 500 mg by mouth 2 (two) times daily.            Dispense as written.   . traMADol (ULTRAM) 50 MG tablet   Oral   Take 50 mg by mouth 2 (two) times daily.         . SUMAtriptan (IMITREX) 25 MG tablet   Oral   Take 25 mg by mouth every 2 (two) hours as needed.          . tobramycin (TOBREX) 0.3 % ophthalmic solution      1 drop 2 (two) times daily. 1 drop into affected eye twice daily           Allergies Sulfa antibiotics  No family history on file.  Social History History  Substance Use Topics  . Smoking status: Never Smoker   . Smokeless tobacco: Not on file  . Alcohol Use: No    Review of Systems  Constitutional: Positive fever. No weight changes Eyes:No blurry vision or double vision.  ENT: No sore throat. Cardiovascular: No chest pain. Respiratory: Shortness of breath and productive  cough Gastrointestinal: Negative for abdominal pain, vomiting and diarrhea.  No BRBPR or melena. Genitourinary: Negative for dysuria, urinary retention, bloody urine, or difficulty urinating. Musculoskeletal: Negative for back pain. No joint swelling or pain. Skin: Negative for rash. Neurological: Negative for headaches, focal weakness or numbness. Psychiatric:No anxiety or depression.   Endocrine:No hot/cold intolerance, changes in energy, or sleep difficulty.  10-point ROS otherwise negative.  ____________________________________________   PHYSICAL EXAM:  VITAL SIGNS: ED Triage Vitals  Enc Vitals Group     BP 02/06/15 0713 136/68 mmHg     Pulse Rate 02/06/15 0713 112     Resp 02/06/15 0713 22     Temp 02/06/15 0713 99.1 F (37.3 C)     Temp Source 02/06/15 0713 Oral     SpO2 02/06/15 0713 96 %     Weight 02/06/15 0713 145 lb (65.772 kg)     Height 02/06/15 0713 5\' 7"  (1.702 m)     Head Cir --      Peak Flow --      Pain Score 02/06/15 0714 0     Pain Loc --      Pain Edu? --      Excl. in GC? --      Constitutional: Alert and oriented. Well appearing and in no distress. Eyes: No scleral icterus. No conjunctival pallor. PERRL. EOMI ENT   Head: Normocephalic and atraumatic.   Nose: No congestion/rhinnorhea. No septal hematoma   Mouth/Throat: Dry mucous membranes, no pharyngeal erythema. No peritonsillar mass. No uvula shift.   Neck: No stridor. No SubQ emphysema. No meningismus. Hematological/Lymphatic/Immunilogical: No cervical lymphadenopathy. Cardiovascular: Tachycardic heart rate of 120. Normal and symmetric distal pulses are present in all extremities. No murmurs, rubs, or gallops. Respiratory: Expiratory wheezes with prolonged expirations. Tachypnea Gastrointestinal: Soft and nontender. No distention. There is no CVA tenderness.  No rebound, rigidity, or guarding. Genitourinary: deferred Musculoskeletal: Nontender with normal range of motion in all  extremities. No joint effusions.  No lower extremity tenderness.  No edema. Neurologic:   Normal speech and language.  CN 2-10 normal. Motor grossly intact. No pronator drift.  Normal gait. No gross focal neurologic deficits are appreciated.  Skin:  Skin is warm, dry and intact. No rash noted.  No petechiae, purpura, or bullae. Psychiatric: Mood and affect are normal. Speech and behavior are normal. Patient exhibits appropriate insight and judgment.  ____________________________________________    LABS (pertinent positives/negatives) (all labs ordered are listed, but only abnormal results are displayed) Labs Reviewed  COMPREHENSIVE METABOLIC PANEL - Abnormal; Notable for the following:    Chloride 99 (*)    Glucose, Bld 104 (*)    Creatinine, Ser 1.71 (*)    Total Bilirubin <0.1 (*)    GFR calc non Af Amer 29 (*)    GFR calc Af Amer 33 (*)    All other components within normal limits  TROPONIN I - Abnormal; Notable for the following:    Troponin I 0.27 (*)    All other components within normal limits  CBC WITH DIFFERENTIAL/PLATELET - Abnormal; Notable for the following:    WBC 18.0 (*)    RBC 3.68 (*)    Hemoglobin 10.2 (*)    HCT 32.7 (*)    MCHC 31.3 (*)    RDW 15.9 (*)    Neutro Abs 13.8 (*)    Monocytes Absolute 1.1 (*)    All other components within normal limits  CULTURE, BLOOD (ROUTINE X 2)  CULTURE, BLOOD (ROUTINE X 2)  CULTURE, EXPECTORATED SPUTUM-ASSESSMENT  URINE CULTURE  LACTIC ACID, PLASMA  LIPASE, BLOOD  APTT  PROTIME-INR  LACTIC ACID, PLASMA  URINALYSIS COMPLETEWITH MICROSCOPIC ([facility] ONLY)   ____________________________________________   EKG Interpreted by me Sinus tachycardia rate 109, leftward axis, normal intervals, normal QRS, ST segments and T waves.   ____________________________________________    RADIOLOGY  Chest x-ray consistent with emphysema without visible  infiltrate  ____________________________________________   PROCEDURES CRITICAL CARE Performed by: Scotty Court, Kemontae Dunklee   Total critical care time: 35 minutes  Critical care time was exclusive of separately billable procedures and treating other patients.  Critical care was necessary to treat or prevent imminent or life-threatening deterioration.  Critical care was time spent personally by me on the following activities: development of treatment plan with patient and/or surrogate as well as nursing, discussions with consultants, evaluation of patient's response to treatment, examination of patient, obtaining history from patient or surrogate, ordering and performing treatments and interventions, ordering and review of laboratory studies, ordering and review of radiographic studies, pulse oximetry and re-evaluation of patient's condition.  ____________________________________________   INITIAL IMPRESSION / ASSESSMENT AND PLAN / ED COURSE  Pertinent labs & imaging results that were available during my care of the patient were reviewed by me and considered in my medical decision making (see chart for details).  Patient presents with SIRS, likely healthcare associated pneumonia. We will give him a DuoNeb, 2 L normal saline IV for resuscitation, and plan to admit. Vang Zosyn and Levaquin ordered.  ----------------------------------------- 9:48 AM on 02/06/2015 ----------------------------------------- Tachycardia improving, blood pressure stable. Workup reveals a white blood cell count of 18, otherwise unremarkable. We'll plan to admit for further management. Discussed with Dr. Enedina Finner who plans to have the patient evaluated by Dr. Juliene Pina who admitted the patient recently, for admission this time.   ____________________________________________   FINAL CLINICAL IMPRESSION(S) / ED DIAGNOSES  Final diagnoses:  Healthcare-associated pneumonia  Sepsis, due to unspecified organism  Sharman Cheek, MD 02/06/15 [phone]

## 2015-02-07 ENCOUNTER — Inpatient Hospital Stay: Payer: Medicare Other

## 2015-02-07 ENCOUNTER — Encounter: Payer: Self-pay | Admitting: Internal Medicine

## 2015-02-07 LAB — CBC
HEMATOCRIT: 27.1 % — AB (ref 35.0–47.0)
HEMOGLOBIN: 8.4 g/dL — AB (ref 12.0–16.0)
MCH: 27.6 pg (ref 26.0–34.0)
MCHC: 30.9 g/dL — AB (ref 32.0–36.0)
MCV: 89.4 fL (ref 80.0–100.0)
Platelets: 324 10*3/uL (ref 150–440)
RBC: 3.04 MIL/uL — ABNORMAL LOW (ref 3.80–5.20)
RDW: 15.8 % — AB (ref 11.5–14.5)
WBC: 12 10*3/uL — ABNORMAL HIGH (ref 3.6–11.0)

## 2015-02-07 LAB — BASIC METABOLIC PANEL
Anion gap: 11 (ref 5–15)
BUN: 22 mg/dL — AB (ref 6–20)
CALCIUM: 8.5 mg/dL — AB (ref 8.9–10.3)
CO2: 26 mmol/L (ref 22–32)
CREATININE: 1.78 mg/dL — AB (ref 0.44–1.00)
Chloride: 100 mmol/L — ABNORMAL LOW (ref 101–111)
GFR, EST AFRICAN AMERICAN: 32 mL/min — AB (ref >60)
GFR, EST NON AFRICAN AMERICAN: 27 mL/min — AB (ref >60)
GLUCOSE: 160 mg/dL — AB (ref 65–99)
POTASSIUM: 4.3 mmol/L (ref 3.5–5.1)
Sodium: 137 mmol/L (ref 135–145)

## 2015-02-07 MED ORDER — METHYLPREDNISOLONE 4 MG PO TABS: 4.0000 mg | ORAL_TABLET | Freq: Every day | ORAL | Status: DC

## 2015-02-07 MED ORDER — ACETAMINOPHEN-CODEINE #2 300-15 MG PO TABS: 1.0000 | ORAL_TABLET | ORAL | Status: DC | PRN

## 2015-02-07 NOTE — Progress Notes (Signed)
Pt in NAD, skin warm and dry, respirations even and unlabored.  Pt denies pain or discomfort at this time.  VSS, SR per monitor.  IV and telemetry discontinued per policy and procedure.  Discharge instructions and Rx given to and reviewed with patient and her husband.  Both verbalized understanding.  Pt discharged home.

## 2015-02-07 NOTE — Discharge Instructions (Signed)
[facility]

## 2015-02-07 NOTE — Discharge Summary (Signed)
[facility] [place] [facility]   PATIENT NAME: Carolyn Lewis    MR#:  [id number]  DATE OF BIRTH:  1940-11-29  DATE OF ADMISSION:  02/06/2015 ADMITTING PHYSICIAN: Adrian Saran, MD  DATE OF DISCHARGE: 02/07/2015 PRIMARY CARE PHYSICIAN: MASOUD,JAVED, MD    ADMISSION DIAGNOSIS:  Healthcare-associated pneumonia [J18.9] Sepsis, due to unspecified organism [A41.9]  DISCHARGE DIAGNOSIS:  Active Problems:   Fever   SECONDARY DIAGNOSIS:   Past Medical History  Diagnosis Date  . COPD (chronic obstructive pulmonary disease)   . COPD (chronic obstructive pulmonary disease)   . Acute MI   . Anxiety   . Hypertension   . GERD (gastroesophageal reflux disease)   . CKD stage G3b/A3, GFR 30 - 44 and albumin creatinine ratio >300 mg/g     HOSPITAL COURSE:  This is a very pleasant 74 year old female with a history of COPD and chronic respiratory failure on 2 L of oxygen with recent admission to the hospital with Escherichia coli sepsis who presented to the emergency department with a temperature of 100.4. In the emergency room she was also noted to have wheezing on physical examination. For further details please further H&P.  1. Fever: Patient only had a fever at home. She was afebrile per auscultation. She was placed on broad-spectrum anti-biotics including Zosyn. Her blood culture negative day. She was recently in the hospital for Escherichia coli sepsis and will continue Augmentin at discharge. There is no clear etiology for fever. Her chest x-ray was not consistent with a pneumonia.  2. Elevated white blood cell count: I suspect this is related to the steroids IV that she was given during her recent hospitalization and not a true infection. Her WBC cell count did improve.  3. COPD acute exacerbation: Patient has chronic respiratory failure on 2 L of oxygen. She was maintained on her oxygen. She was started on IV spirits. She will be discharged with Medrol Dosepak  and continued on her antibiotics. Her wheezing has improved. she also underwent an echocardiogram to evaluate for congestive heart failure. Her ejection fraction is normal. She does have some diastolic dysfunction. However she was not in acute congestive heart failure.    4. CAD: Patient will continue on her outpatient medications.  5. Diastolic heart failure: No acute exacerbation at this time.  6. Chronic kidney disease stage III: Patient's cranial remained stable.     CONSULTS OBTAINED:    NONE  DISCHARGE DIET   patient will be discharged home with home health care on a heart healthy diet  Allergies  Allergen Reactions  . Sulfa Antibiotics Rash    DISCHARGE MEDICATIONS:   Current Discharge Medication List    START taking these medications   Details  acetaminophen-codeine (TYLENOL #2) 300-15 MG per tablet Take 1 tablet by mouth every 4 (four) hours as needed for moderate pain. Qty: 30 tablet, Refills: 0    methylPREDNISolone (MEDROL) 4 MG tablet Take 1 tablet (4 mg total) by mouth daily. MEDROL doe pak taper over 6 days as Rx Qty: 20 tablet, Refills: 0      CONTINUE these medications which have NOT CHANGED   Details  amitriptyline (ELAVIL) 25 MG tablet Take 25 mg by mouth at bedtime.    amLODipine (NORVASC) 5 MG tablet Take 5 mg by mouth daily.    amoxicillin-clavulanate (AUGMENTIN) 875-125 MG per tablet Take 1 tablet by mouth every 12 (twelve) hours. Qty: 24 tablet, Refills: 0    atorvastatin (LIPITOR) 40 MG tablet Take  40 mg by mouth daily.    clopidogrel (PLAVIX) 75 MG tablet Take 75 mg by mouth daily.    diazepam (VALIUM) 2 MG tablet Take 2 mg by mouth 2 (two) times daily.    enalapril (VASOTEC) 2.5 MG tablet Take 2.5 mg by mouth daily.    famotidine (PEPCID) 20 MG tablet Take 20 mg by mouth daily.    furosemide (LASIX) 20 MG tablet Take 20 mg by mouth once. Patient only takes Monday, Wednesday, and Friday.    Ipratropium-Albuterol (COMBIVENT RESPIMAT)  20-100 MCG/ACT AERS respimat Inhale 1 puff into the lungs 3 (three) times daily as needed for wheezing or shortness of breath.    levalbuterol (XOPENEX) 1.25 MG/3ML nebulizer solution Take 1.25 mg by nebulization every 4 (four) hours as needed for wheezing or shortness of breath.    metoprolol (LOPRESSOR) 50 MG tablet Take 1 tablet (50 mg total) by mouth 2 (two) times daily. Qty: 60 tablet, Refills: 0    nitroGLYCERIN (NITROSTAT) 0.4 MG SL tablet Place 0.4 mg under the tongue every 5 (five) minutes as needed for chest pain.    pantoprazole (PROTONIX) 40 MG tablet Take 40 mg by mouth daily.    RANEXA 500 MG 12 hr tablet Take 500 mg by mouth 2 (two) times daily.     traMADol (ULTRAM) 50 MG tablet Take 50 mg by mouth 2 (two) times daily.    SUMAtriptan (IMITREX) 25 MG tablet Take 25 mg by mouth every 2 (two) hours as needed.     tobramycin (TOBREX) 0.3 % ophthalmic solution 1 drop 2 (two) times daily. 1 drop into affected eye twice daily              Today   patient is doing well this morning. Patient reports she is at her baseline shortness of breath. Patient denies wheezing or chest pain. Patient is afebrile. p patient is doing well this morning. Patient reports her baseline shortness of breath but no new shortness of breath. Patient denies chest pain or lower extremity edema. Patient has no fevers overnight. atient is doing well this morning. Patient reports   VITAL SI no shortness of breath or chest pain.GNS:  Blood pressure 122/57, pulse 83, temperature 98.2 F (36.8 C), temperature source Oral, resp. rate 18, height  (1.702 m), weight 65.772 kg (145 lb), SpO2 94 %.   REVIEW OF SYSTEMS:  Review of Systems  Constitutional: Negative for fever, chills and malaise/fatigue.  HENT: Negative for sore throat.   Eyes: Negative for blurred vision.  Respiratory: Positive for shortness of breath (at baseline). Negative for cough, hemoptysis and wheezing.   Cardiovascular:  Negative for chest pain, palpitations and leg swelling.  Gastrointestinal: Negative for nausea, vomiting, abdominal pain, diarrhea and blood in stool.  Genitourinary: Negative for dysuria.  Musculoskeletal: Negative for back pain.  Neurological: Negative for dizziness, tremors and headaches.  Endo/Heme/Allergies: Does not bruise/bleed easily.     PHYSICAL EXAMINATION:  GENERAL:  74 y.o.-year-old patient lying in the bed with no acute distress.  NECK:  Supple, no jugular venous distention. No thyroid enlargement, no tenderness.  LUNGS: Normal breath sounds bilaterally, no wheezing, rales,rhonchi  No use of accessory muscles of respiration.  CARDIOVASCULAR2/6 systolic ejection murmur heard best at the right sternal border..  ABDOMEN: Soft, non-tender, non-distended. Bowel sounds present. No organomegaly or mass.  EXTREMITIES: No pedal edema, cyanosis, or clubbing.  PSYCHIATRIC: The patient is alert and oriented x 3.  SKIN: No obvious rash, lesion, or ulcer.  DATA REVIEW:   CBC  Recent Labs Lab 02/07/15 0341  WBC 12.0*  HGB 8.4*  HCT 27.1*  PLT 324    Chemistries   Recent Labs Lab 02/06/15 0743  02/07/15 0341  NA 138  --  137  K 3.8  --  4.3  CL 99*  --  100*  CO2 27  --  26  GLUCOSE 104*  < > 160*  BUN 19  --  22*  CREATININE 1.71*  --  1.78*  CALCIUM 8.9  --  8.5*  AST 31  --   --   ALT 17  --   --   ALKPHOS 126  --   --   BILITOT <0.1*  --   --   < > = values in this interval not displayed.  Cardiac Enzymes  Recent Labs Lab 02/06/15 0743  TROPONINI 0.27*    Microbiology Results  @  RADIOLOGY:  Dg Chest 1 View  02/07/2015   CLINICAL DATA:  Two day history of fever  EXAM: CHEST  1 VIEW  COMPARISON:  February 06, 2015  FINDINGS: There is a degree of underlying emphysematous change which appears stable. There is no edema or consolidation. Heart size and pulmonary vascularity are normal. No adenopathy. No bone lesions.  IMPRESSION: There is felt to  be a degree of underlying emphysematous change. No edema or consolidation.   Electronically Signed   By: Bretta Bang III M.D.   On: 02/07/2015 07:24   Dg Chest Port 1 View  02/06/2015   CLINICAL DATA:  Fever.  Productive cough.  EXAM: PORTABLE CHEST - 1 VIEW  COMPARISON:  02/02/2015, 02/01/2015 and 01/31/2015  FINDINGS: Heart size and pulmonary vascularity are normal. No infiltrates or effusions. Hyperinflation of the lungs consistent with emphysema. No acute osseous abnormality.  IMPRESSION: Emphysema.  No acute infiltrates.   Electronically Signed   By: Francene Boyers M.D.   On: 02/06/2015 07:50      Management plans discussed with the patient and she is in agreement. Stable for discharge home with [facility]  Patient should follow up with Dr. Juel Burrow Friday  CODE STATUS:     Code Status Orders        Start     Ordered   02/06/15 1510  Full code   Continuous     02/06/15 1510      TOTAL TIME TAKING CARE OF THIS PATIENT: 38 minutes.    Taralee Marcus M.D on 02/07/2015 at 10:59 AM  Between 7am to 6pm - Pager - [phone] After 6pm go to [email or web address] - password [email or web address] [facility]  [place] [facility]  Office  [phone]  CC: Primary care physician; Corky Downs, MD

## 2015-02-07 NOTE — Care Management (Signed)
Patient is for discharge today and resumption of home health SN PT and aide.  Notified [facility].  Had to leave VM message

## 2015-02-07 NOTE — Progress Notes (Signed)
PT Cancellation Note  Patient Details Name: Carolyn Lewis MRN: [id number] DOB: [birth year]   Cancelled Treatment:    Reason Eval/Treat Not Completed: Patient declined, no reason specified (Reports she has a headache and is generally not feeling well; declines participation with evaluation at this time.  Will re-attempt at later time this date.)   Kenston Longton H. Manson Passey, PT, DPT 02/07/2015, 9:30 AM [phone]

## 2015-02-08 LAB — URINE CULTURE

## 2015-02-09 DIAGNOSIS — J150 Pneumonia due to Klebsiella pneumoniae: Secondary | ICD-10-CM | POA: Diagnosis not present

## 2015-02-09 DIAGNOSIS — I739 Peripheral vascular disease, unspecified: Secondary | ICD-10-CM | POA: Diagnosis not present

## 2015-02-09 DIAGNOSIS — G43119 Migraine with aura, intractable, without status migrainosus: Secondary | ICD-10-CM | POA: Diagnosis not present

## 2015-02-09 DIAGNOSIS — K143 Hypertrophy of tongue papillae: Secondary | ICD-10-CM | POA: Diagnosis not present

## 2015-02-11 LAB — CULTURE, BLOOD (ROUTINE X 2)
CULTURE: NO GROWTH
Culture: NO GROWTH

## 2015-02-11 LAB — CULTURE, RESPIRATORY

## 2015-02-11 LAB — CULTURE, RESPIRATORY W GRAM STAIN

## 2015-02-12 DIAGNOSIS — J449 Chronic obstructive pulmonary disease, unspecified: Secondary | ICD-10-CM | POA: Diagnosis not present

## 2015-02-13 DIAGNOSIS — E784 Other hyperlipidemia: Secondary | ICD-10-CM | POA: Diagnosis not present

## 2015-02-13 DIAGNOSIS — K219 Gastro-esophageal reflux disease without esophagitis: Secondary | ICD-10-CM | POA: Diagnosis not present

## 2015-02-13 DIAGNOSIS — J441 Chronic obstructive pulmonary disease with (acute) exacerbation: Secondary | ICD-10-CM | POA: Diagnosis not present

## 2015-02-13 DIAGNOSIS — I129 Hypertensive chronic kidney disease with stage 1 through stage 4 chronic kidney disease, or unspecified chronic kidney disease: Secondary | ICD-10-CM | POA: Diagnosis not present

## 2015-02-13 DIAGNOSIS — I5030 Unspecified diastolic (congestive) heart failure: Secondary | ICD-10-CM | POA: Diagnosis not present

## 2015-02-13 DIAGNOSIS — J440 Chronic obstructive pulmonary disease with acute lower respiratory infection: Secondary | ICD-10-CM | POA: Diagnosis not present

## 2015-02-13 DIAGNOSIS — I10 Essential (primary) hypertension: Secondary | ICD-10-CM | POA: Diagnosis not present

## 2015-02-13 DIAGNOSIS — F419 Anxiety disorder, unspecified: Secondary | ICD-10-CM | POA: Diagnosis not present

## 2015-02-13 DIAGNOSIS — I2510 Atherosclerotic heart disease of native coronary artery without angina pectoris: Secondary | ICD-10-CM | POA: Diagnosis not present

## 2015-02-13 DIAGNOSIS — N183 Chronic kidney disease, stage 3 (moderate): Secondary | ICD-10-CM | POA: Diagnosis not present

## 2015-02-16 DIAGNOSIS — L298 Other pruritus: Secondary | ICD-10-CM | POA: Diagnosis not present

## 2015-02-16 DIAGNOSIS — G43119 Migraine with aura, intractable, without status migrainosus: Secondary | ICD-10-CM | POA: Diagnosis not present

## 2015-02-16 DIAGNOSIS — R42 Dizziness and giddiness: Secondary | ICD-10-CM | POA: Diagnosis not present

## 2015-02-16 DIAGNOSIS — D649 Anemia, unspecified: Secondary | ICD-10-CM | POA: Diagnosis not present

## 2015-02-16 DIAGNOSIS — K222 Esophageal obstruction: Secondary | ICD-10-CM | POA: Diagnosis not present

## 2015-02-19 DIAGNOSIS — K143 Hypertrophy of tongue papillae: Secondary | ICD-10-CM | POA: Diagnosis not present

## 2015-02-19 DIAGNOSIS — A419 Sepsis, unspecified organism: Secondary | ICD-10-CM | POA: Diagnosis not present

## 2015-02-19 DIAGNOSIS — M5416 Radiculopathy, lumbar region: Secondary | ICD-10-CM | POA: Diagnosis not present

## 2015-02-19 DIAGNOSIS — I714 Abdominal aortic aneurysm, without rupture: Secondary | ICD-10-CM | POA: Diagnosis not present

## 2015-02-22 DIAGNOSIS — J449 Chronic obstructive pulmonary disease, unspecified: Secondary | ICD-10-CM | POA: Diagnosis not present

## 2015-02-23 DIAGNOSIS — G43119 Migraine with aura, intractable, without status migrainosus: Secondary | ICD-10-CM | POA: Diagnosis not present

## 2015-02-23 DIAGNOSIS — J449 Chronic obstructive pulmonary disease, unspecified: Secondary | ICD-10-CM | POA: Diagnosis not present

## 2015-02-23 DIAGNOSIS — K2960 Other gastritis without bleeding: Secondary | ICD-10-CM | POA: Diagnosis not present

## 2015-02-23 DIAGNOSIS — A419 Sepsis, unspecified organism: Secondary | ICD-10-CM | POA: Diagnosis not present

## 2015-03-06 DIAGNOSIS — M4854XA Collapsed vertebra, not elsewhere classified, thoracic region, initial encounter for fracture: Secondary | ICD-10-CM | POA: Diagnosis not present

## 2015-03-06 DIAGNOSIS — I714 Abdominal aortic aneurysm, without rupture: Secondary | ICD-10-CM | POA: Diagnosis not present

## 2015-03-06 DIAGNOSIS — J440 Chronic obstructive pulmonary disease with acute lower respiratory infection: Secondary | ICD-10-CM | POA: Diagnosis not present

## 2015-03-06 DIAGNOSIS — I739 Peripheral vascular disease, unspecified: Secondary | ICD-10-CM | POA: Diagnosis not present

## 2015-03-07 ENCOUNTER — Other Ambulatory Visit: Payer: Self-pay | Admitting: Internal Medicine

## 2015-03-07 ENCOUNTER — Ambulatory Visit
Admission: RE | Admit: 2015-03-07 | Discharge: 2015-03-07 | Disposition: A | Discharge status: Home / Self Care | Payer: Medicare Other | Source: Ambulatory Visit | Attending: Cardiology | Admitting: Cardiology

## 2015-03-07 ENCOUNTER — Ambulatory Visit
Admission: RE | Admit: 2015-03-07 | Discharge: 2015-03-07 | Disposition: A | Discharge status: Home / Self Care | Payer: Medicare Other | Source: Ambulatory Visit | Attending: Internal Medicine | Admitting: Internal Medicine

## 2015-03-07 DIAGNOSIS — M549 Dorsalgia, unspecified: Secondary | ICD-10-CM

## 2015-03-07 DIAGNOSIS — M47814 Spondylosis without myelopathy or radiculopathy, thoracic region: Secondary | ICD-10-CM | POA: Diagnosis not present

## 2015-03-07 DIAGNOSIS — M8580 Other specified disorders of bone density and structure, unspecified site: Secondary | ICD-10-CM | POA: Insufficient documentation

## 2015-03-07 DIAGNOSIS — M8588 Other specified disorders of bone density and structure, other site: Secondary | ICD-10-CM | POA: Diagnosis not present

## 2015-03-07 DIAGNOSIS — M5134 Other intervertebral disc degeneration, thoracic region: Secondary | ICD-10-CM | POA: Diagnosis not present

## 2015-03-08 DIAGNOSIS — M539 Dorsopathy, unspecified: Secondary | ICD-10-CM | POA: Diagnosis not present

## 2015-03-08 DIAGNOSIS — M4692 Unspecified inflammatory spondylopathy, cervical region: Secondary | ICD-10-CM | POA: Diagnosis not present

## 2015-03-08 DIAGNOSIS — K222 Esophageal obstruction: Secondary | ICD-10-CM | POA: Diagnosis not present

## 2015-03-08 DIAGNOSIS — G542 Cervical root disorders, not elsewhere classified: Secondary | ICD-10-CM | POA: Diagnosis not present

## 2015-03-14 DIAGNOSIS — J449 Chronic obstructive pulmonary disease, unspecified: Secondary | ICD-10-CM | POA: Diagnosis not present

## 2015-03-20 ENCOUNTER — Other Ambulatory Visit: Payer: Self-pay | Admitting: Internal Medicine

## 2015-03-20 DIAGNOSIS — J440 Chronic obstructive pulmonary disease with acute lower respiratory infection: Secondary | ICD-10-CM | POA: Diagnosis not present

## 2015-03-20 DIAGNOSIS — K859 Acute pancreatitis, unspecified: Secondary | ICD-10-CM

## 2015-03-20 DIAGNOSIS — F411 Generalized anxiety disorder: Secondary | ICD-10-CM | POA: Diagnosis not present

## 2015-03-20 DIAGNOSIS — I714 Abdominal aortic aneurysm, without rupture: Secondary | ICD-10-CM | POA: Diagnosis not present

## 2015-03-21 ENCOUNTER — Other Ambulatory Visit: Payer: Self-pay | Admitting: Internal Medicine

## 2015-03-21 DIAGNOSIS — R109 Unspecified abdominal pain: Secondary | ICD-10-CM

## 2015-03-21 DIAGNOSIS — R05 Cough: Secondary | ICD-10-CM

## 2015-03-21 DIAGNOSIS — R058 Other specified cough: Secondary | ICD-10-CM

## 2015-03-23 ENCOUNTER — Ambulatory Visit
Admission: RE | Admit: 2015-03-23 | Discharge: 2015-03-23 | Disposition: A | Discharge status: Home / Self Care | Payer: Medicare Other | Source: Ambulatory Visit | Attending: Internal Medicine | Admitting: Internal Medicine

## 2015-03-23 DIAGNOSIS — K859 Acute pancreatitis, unspecified: Secondary | ICD-10-CM

## 2015-03-23 DIAGNOSIS — I2510 Atherosclerotic heart disease of native coronary artery without angina pectoris: Secondary | ICD-10-CM | POA: Diagnosis not present

## 2015-03-23 DIAGNOSIS — R058 Other specified cough: Secondary | ICD-10-CM

## 2015-03-23 DIAGNOSIS — I714 Abdominal aortic aneurysm, without rupture: Secondary | ICD-10-CM | POA: Diagnosis not present

## 2015-03-23 DIAGNOSIS — R079 Chest pain, unspecified: Secondary | ICD-10-CM | POA: Diagnosis not present

## 2015-03-23 DIAGNOSIS — I10 Essential (primary) hypertension: Secondary | ICD-10-CM | POA: Diagnosis not present

## 2015-03-23 DIAGNOSIS — R109 Unspecified abdominal pain: Secondary | ICD-10-CM | POA: Diagnosis not present

## 2015-03-23 DIAGNOSIS — R05 Cough: Secondary | ICD-10-CM

## 2015-03-23 DIAGNOSIS — J449 Chronic obstructive pulmonary disease, unspecified: Secondary | ICD-10-CM | POA: Diagnosis not present

## 2015-03-23 DIAGNOSIS — N261 Atrophy of kidney (terminal): Secondary | ICD-10-CM | POA: Diagnosis not present

## 2015-03-23 DIAGNOSIS — R5381 Other malaise: Secondary | ICD-10-CM | POA: Diagnosis not present

## 2015-03-26 DIAGNOSIS — R109 Unspecified abdominal pain: Secondary | ICD-10-CM | POA: Diagnosis not present

## 2015-03-26 DIAGNOSIS — I714 Abdominal aortic aneurysm, without rupture: Secondary | ICD-10-CM | POA: Diagnosis not present

## 2015-04-03 DIAGNOSIS — I714 Abdominal aortic aneurysm, without rupture: Secondary | ICD-10-CM | POA: Diagnosis not present

## 2015-04-03 DIAGNOSIS — J449 Chronic obstructive pulmonary disease, unspecified: Secondary | ICD-10-CM | POA: Diagnosis not present

## 2015-04-03 DIAGNOSIS — I517 Cardiomegaly: Secondary | ICD-10-CM | POA: Diagnosis not present

## 2015-04-03 DIAGNOSIS — K222 Esophageal obstruction: Secondary | ICD-10-CM | POA: Diagnosis not present

## 2015-04-14 DIAGNOSIS — J449 Chronic obstructive pulmonary disease, unspecified: Secondary | ICD-10-CM | POA: Diagnosis not present

## 2015-05-15 DIAGNOSIS — J449 Chronic obstructive pulmonary disease, unspecified: Secondary | ICD-10-CM | POA: Diagnosis not present

## 2015-05-29 DIAGNOSIS — K222 Esophageal obstruction: Secondary | ICD-10-CM | POA: Diagnosis not present

## 2015-05-29 DIAGNOSIS — I714 Abdominal aortic aneurysm, without rupture: Secondary | ICD-10-CM | POA: Diagnosis not present

## 2015-05-29 DIAGNOSIS — J449 Chronic obstructive pulmonary disease, unspecified: Secondary | ICD-10-CM | POA: Diagnosis not present

## 2015-05-29 DIAGNOSIS — J440 Chronic obstructive pulmonary disease with acute lower respiratory infection: Secondary | ICD-10-CM | POA: Diagnosis not present

## 2015-05-30 DIAGNOSIS — Z23 Encounter for immunization: Secondary | ICD-10-CM | POA: Diagnosis not present

## 2015-06-14 DIAGNOSIS — J449 Chronic obstructive pulmonary disease, unspecified: Secondary | ICD-10-CM | POA: Diagnosis not present

## 2015-07-09 DIAGNOSIS — Z0000 Encounter for general adult medical examination without abnormal findings: Secondary | ICD-10-CM | POA: Diagnosis not present

## 2015-07-15 DIAGNOSIS — J449 Chronic obstructive pulmonary disease, unspecified: Secondary | ICD-10-CM | POA: Diagnosis not present

## 2015-08-14 DIAGNOSIS — J449 Chronic obstructive pulmonary disease, unspecified: Secondary | ICD-10-CM | POA: Diagnosis not present

## 2015-09-14 DIAGNOSIS — J449 Chronic obstructive pulmonary disease, unspecified: Secondary | ICD-10-CM | POA: Diagnosis not present

## 2015-10-15 DIAGNOSIS — J449 Chronic obstructive pulmonary disease, unspecified: Secondary | ICD-10-CM | POA: Diagnosis not present

## 2015-10-25 IMAGING — US US ABDOMEN COMPLETE
1 series · 13 of 25 positions shown · non-contrast
Comparison: Abdominal ultrasound dated 05/03/2009.

CLINICAL DATA: Acute pancreatitis, abdominal pain for 1 month.
History of cholecystectomy and hysterectomy. Known abdominal aortic
aneurysm.

EXAM:
ULTRASOUND ABDOMEN COMPLETE

[Series 1: us abdomen complete · 0.20mm/px · 13 of 158 slices shown]
[im 1/158]
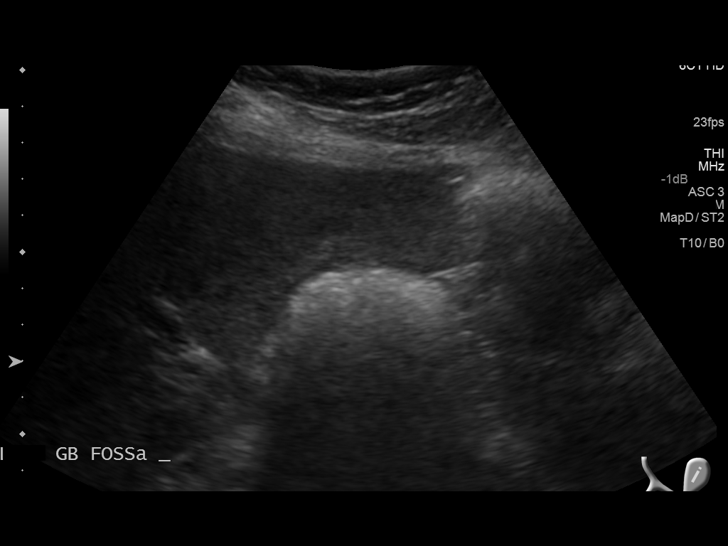
[im 14/158]
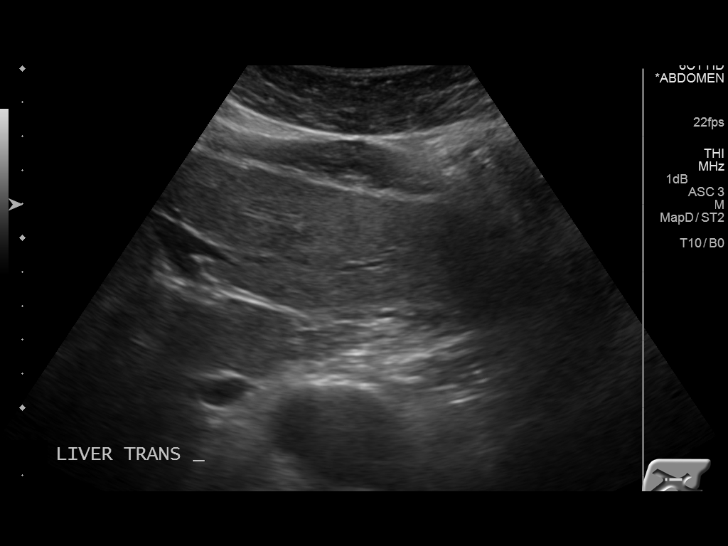
[im 27/158]
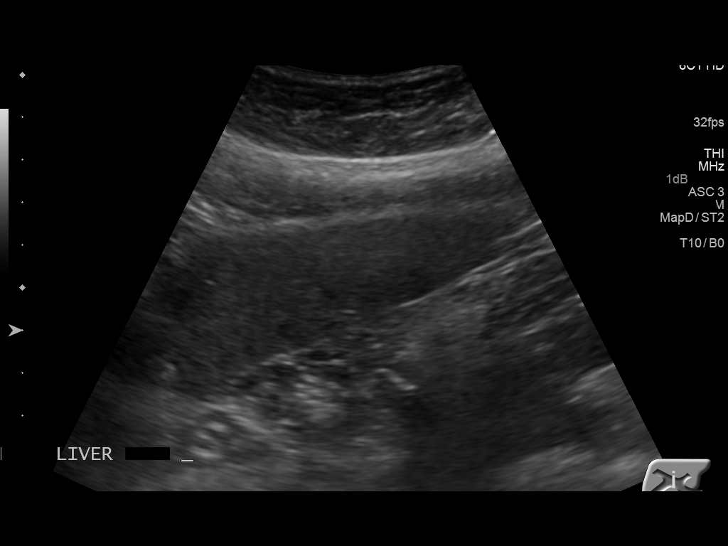
[im 40/158]
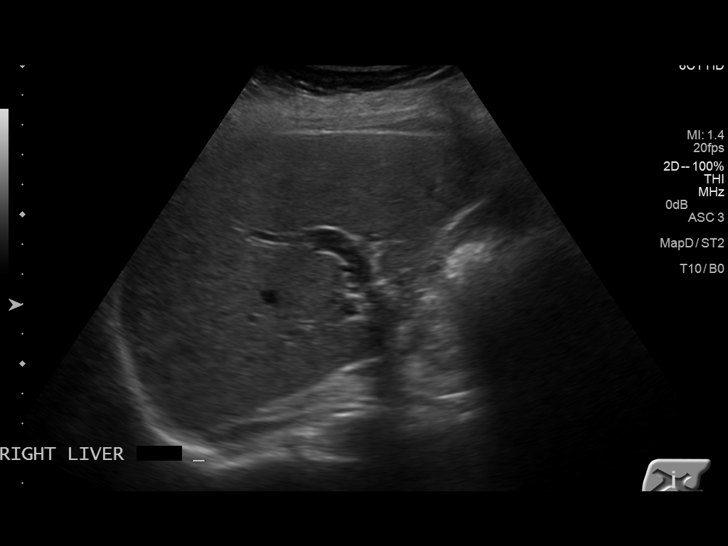
[im 53/158]
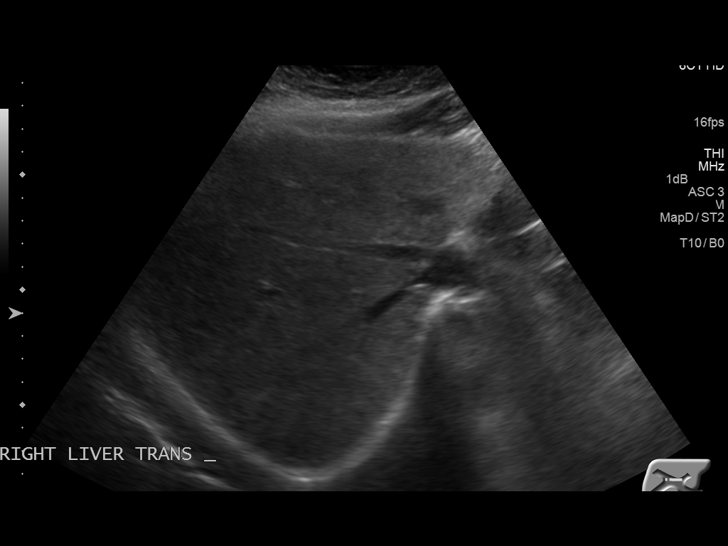
[im 66/158]
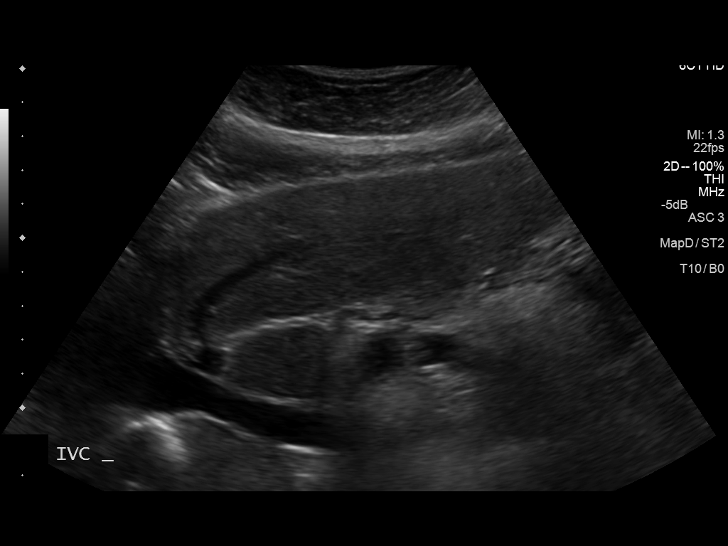
[im 79/158]
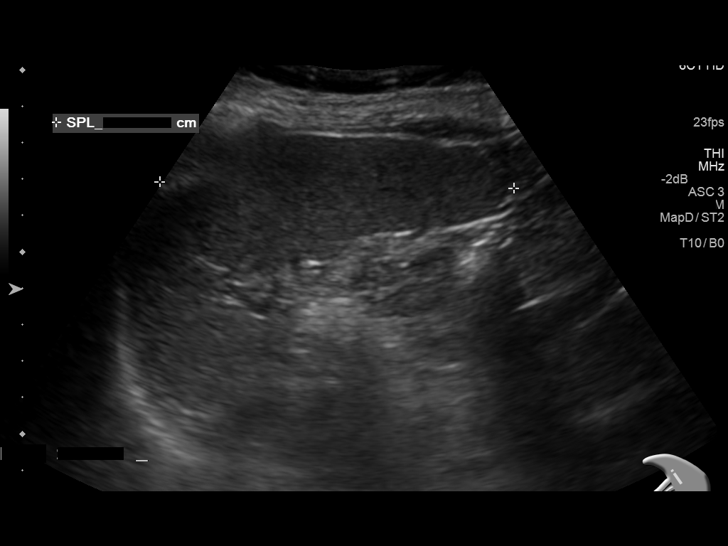
[im 92/158]
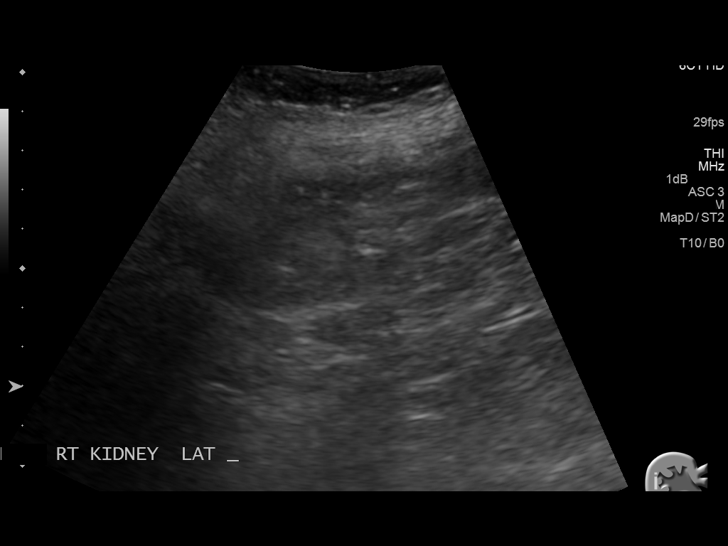
[im 105/158]
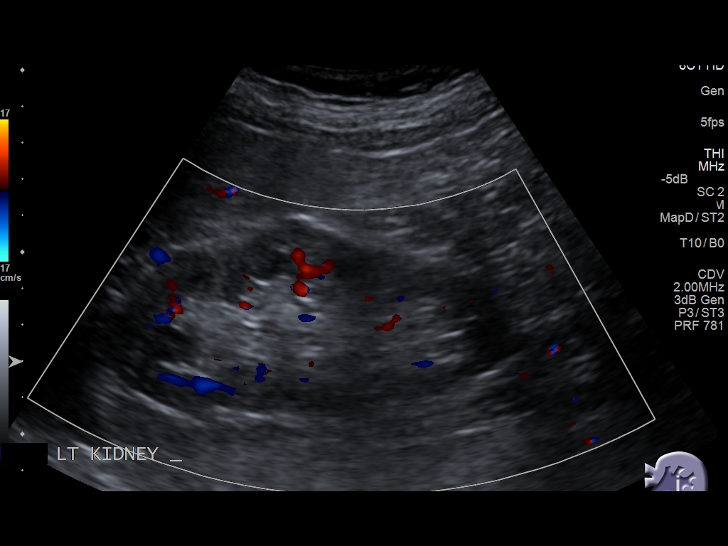
[im 118/158]
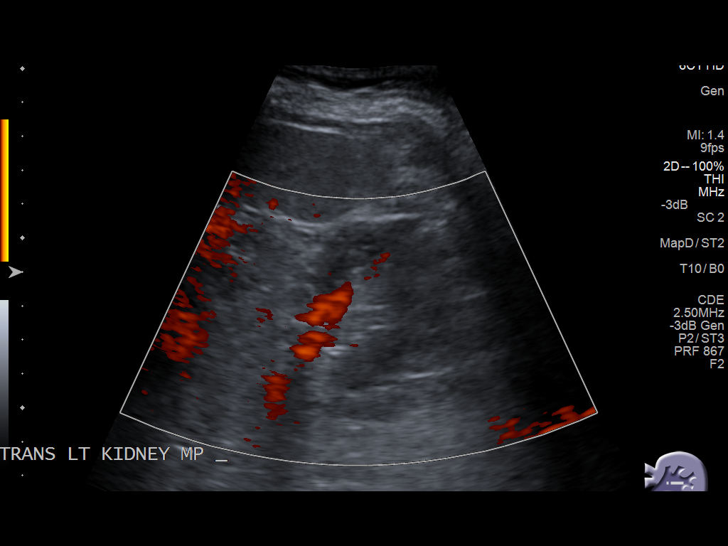
[im 131/158]
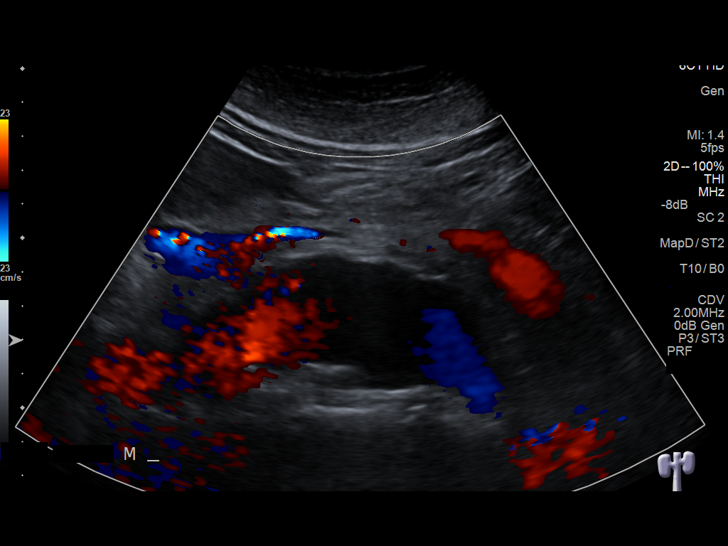
[im 144/158]
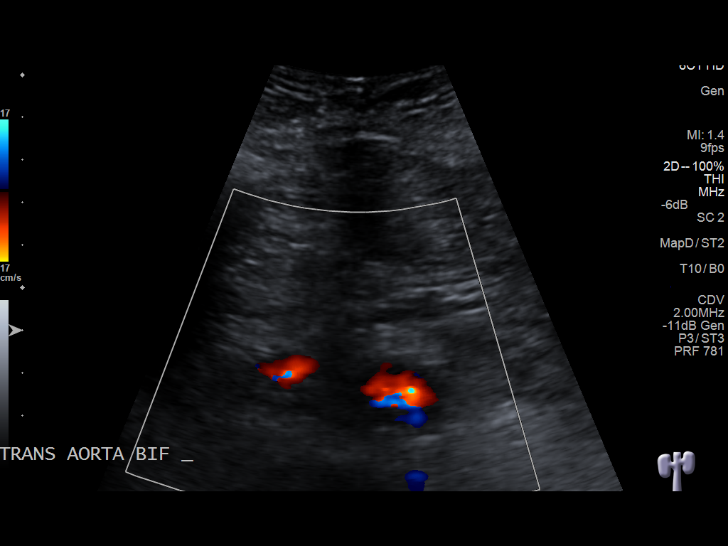
[im 158/158]
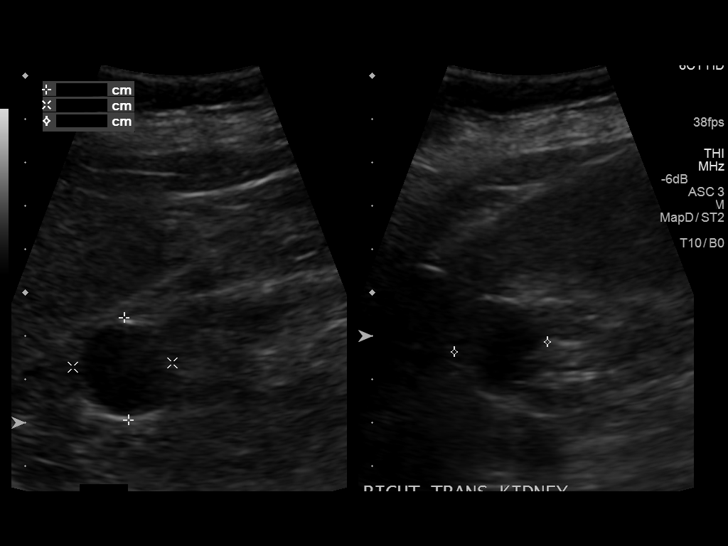

[13 of 25 positions shown; findings below may reference images not displayed]

FINDINGS: Gallbladder: Status postcholecystectomy.

Common bile duct: Diameter: Within normal limits at 7 mm diameter.
No bile duct stone demonstrated.

Liver: No focal lesion identified. Within normal limits in
parenchymal echogenicity.

IVC: No abnormality visualized.

Pancreas: Pancreatic head and body appear normal. Pancreatic tail
obscured by overlying bowel gas. No peripancreatic fluid seen.

Spleen: Size and appearance within normal limits.

Right Kidney: Length: 6.7 cm. Right kidney appears atrophic with
cortical thinning/scarring throughout. Hypoechoic mass within the
upper pole right renal cortex measures 2.4 x 2.2 cm, most likely a
cyst. The cyst was better seen on the previous ultrasound.

Left Kidney: Length: 11.6 cm. Left kidney is normal in size and
echogenicity without mass, cyst, stone, or hydronephrosis.

Abdominal aorta: Proximal abdominal aorta measures 3.7 cm diameter.
Mid abdominal aorta measures 5 cm diameter. Distal abdominal aorta
measures 2.6 cm diameter. Both common iliac arteries measure 1.5 cm
diameter.

Other findings: None.
IMPRESSION: 1. Patient's known abdominal aortic aneurysm has increased in size
compared to the previous abdomen ultrasound of 05/03/2009. Proximal
abdominal aorta currently measures 3.7 cm diameter (previously
cm) and the mid abdominal aorta currently measures 5 cm diameter
(previously 3.5 cm). Recommend followup by abdomen and pelvis CTA in
3-6 months, and vascular surgery referral/consultation if not
already obtained. This recommendation follows ACR consensus
guidelines: White Paper of the ACR Incidental Findings Committee II
on Vascular Findings. [HOSPITAL] 2563; [DATE].
2. Right kidney now appears atrophic, measuring 6.7 cm in length,
with cortex thinning/scarring throughout. This represents a change
compared to the previous ultrasound where it measured 9.4 cm in
length and was without significant cortex thinning/scarring. Renal
artery stenosis? CT angiogram would also be able to evaluate for a
possible associated renal artery stenosis.
3. Status postcholecystectomy.
4. Remainder of the abdomen ultrasound is unremarkable. No evidence
of pancreatitis seen.

## 2015-11-12 DIAGNOSIS — J449 Chronic obstructive pulmonary disease, unspecified: Secondary | ICD-10-CM | POA: Diagnosis not present

## 2015-11-27 DIAGNOSIS — J449 Chronic obstructive pulmonary disease, unspecified: Secondary | ICD-10-CM | POA: Diagnosis not present

## 2015-11-27 DIAGNOSIS — K222 Esophageal obstruction: Secondary | ICD-10-CM | POA: Diagnosis not present

## 2015-11-27 DIAGNOSIS — G43709 Chronic migraine without aura, not intractable, without status migrainosus: Secondary | ICD-10-CM | POA: Diagnosis not present

## 2015-11-27 DIAGNOSIS — I208 Other forms of angina pectoris: Secondary | ICD-10-CM | POA: Diagnosis not present

## 2015-12-13 DIAGNOSIS — J449 Chronic obstructive pulmonary disease, unspecified: Secondary | ICD-10-CM | POA: Diagnosis not present

## 2016-01-12 DIAGNOSIS — J449 Chronic obstructive pulmonary disease, unspecified: Secondary | ICD-10-CM | POA: Diagnosis not present

## 2016-02-12 DIAGNOSIS — J449 Chronic obstructive pulmonary disease, unspecified: Secondary | ICD-10-CM | POA: Diagnosis not present

## 2016-03-06 DIAGNOSIS — R000 Tachycardia, unspecified: Secondary | ICD-10-CM | POA: Diagnosis not present

## 2016-03-06 DIAGNOSIS — K222 Esophageal obstruction: Secondary | ICD-10-CM | POA: Diagnosis not present

## 2016-03-07 IMAGING — CR DG CHEST 1V
1 series · 1 of 1 positions shown · non-contrast
Comparison: February 06, 2015

CLINICAL DATA: Two day history of fever

EXAM:
CHEST  1 VIEW

[ap]
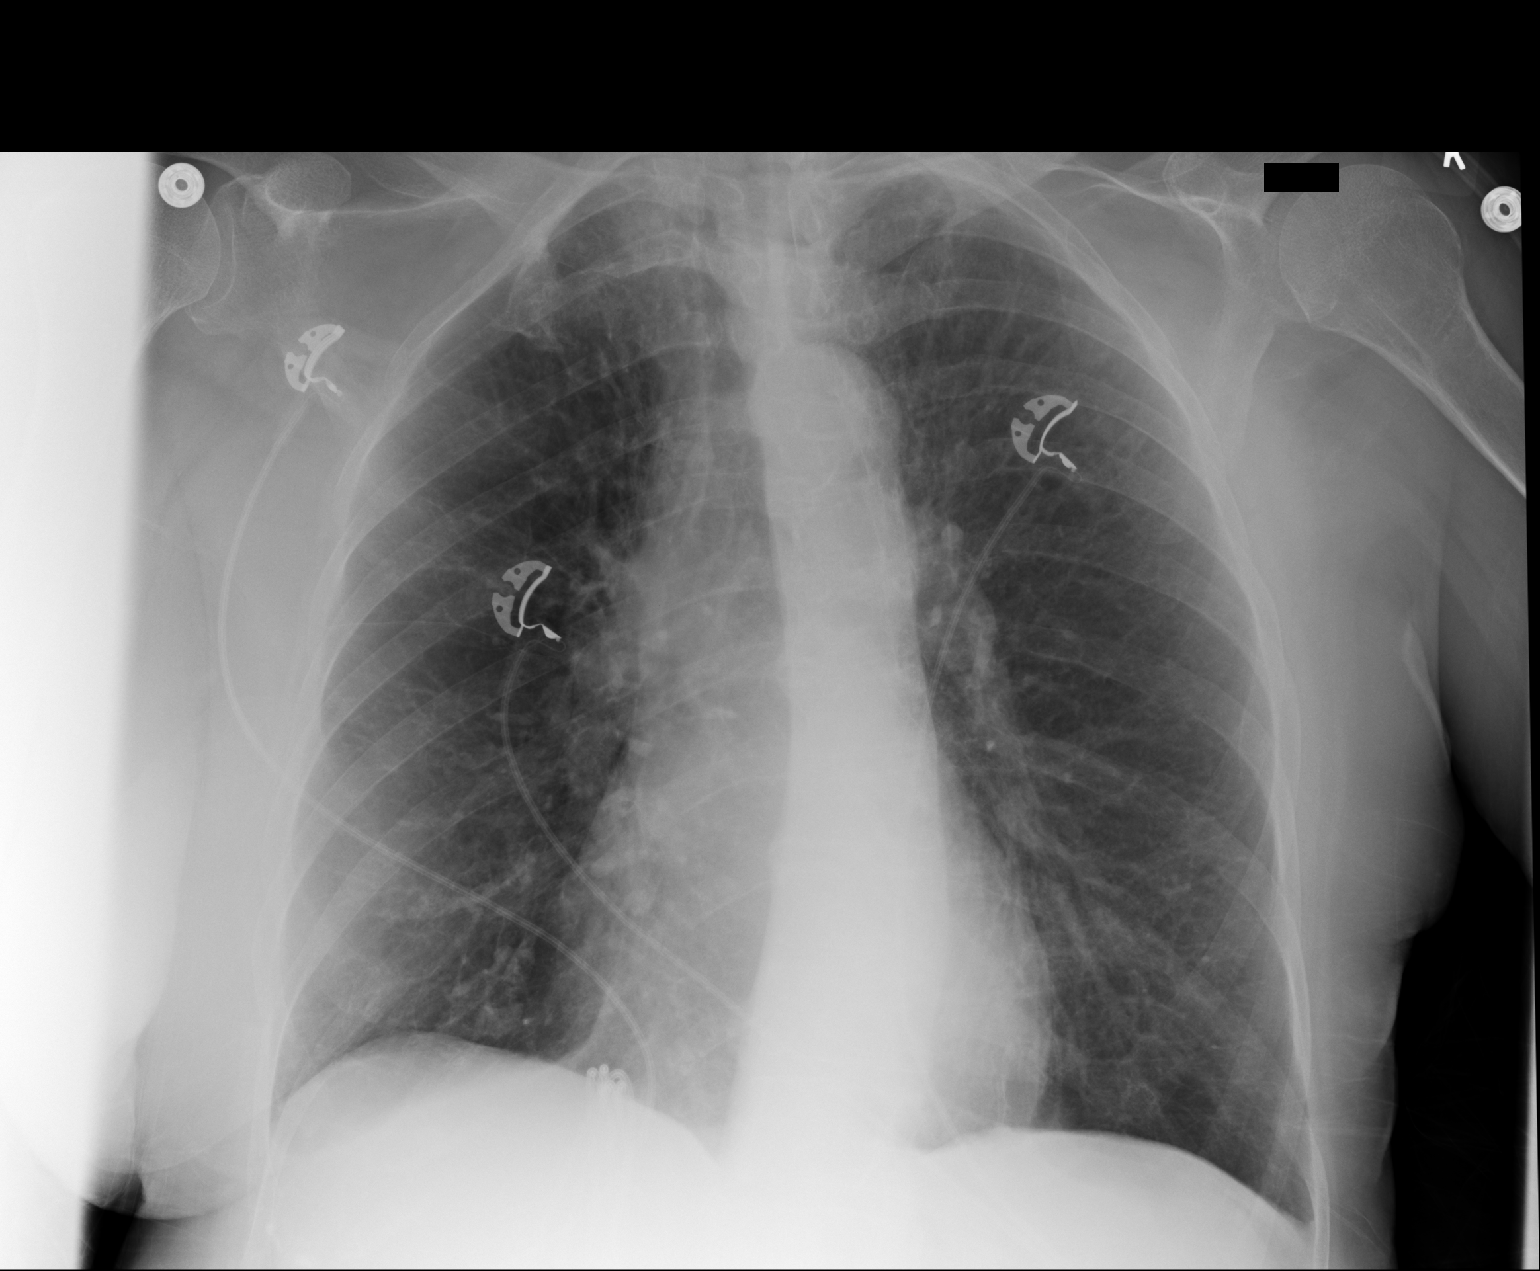

[1 of 1 positions shown; findings below may reference images not displayed]

FINDINGS: There is a degree of underlying emphysematous change which appears
stable. There is no edema or consolidation. Heart size and pulmonary
vascularity are normal. No adenopathy. No bone lesions.
IMPRESSION: There is felt to be a degree of underlying emphysematous change. No
edema or consolidation.

## 2016-03-13 DIAGNOSIS — J449 Chronic obstructive pulmonary disease, unspecified: Secondary | ICD-10-CM | POA: Diagnosis not present

## 2016-04-13 DIAGNOSIS — J449 Chronic obstructive pulmonary disease, unspecified: Secondary | ICD-10-CM | POA: Diagnosis not present

## 2016-04-15 DIAGNOSIS — J449 Chronic obstructive pulmonary disease, unspecified: Secondary | ICD-10-CM | POA: Diagnosis not present

## 2016-04-15 DIAGNOSIS — K222 Esophageal obstruction: Secondary | ICD-10-CM | POA: Diagnosis not present

## 2016-04-15 DIAGNOSIS — R000 Tachycardia, unspecified: Secondary | ICD-10-CM | POA: Diagnosis not present

## 2016-04-20 IMAGING — CR DG CHEST 2V
1 series · 2 of 2 positions shown · non-contrast
Comparison: February 07, 2015

CLINICAL DATA: Three-week history of chest pain.  Cough.

EXAM:
CHEST  2 VIEW

[Series 1: dg chest 2 view · 0.14mm/px · 2 of 2 slices shown]
[im 1/2]
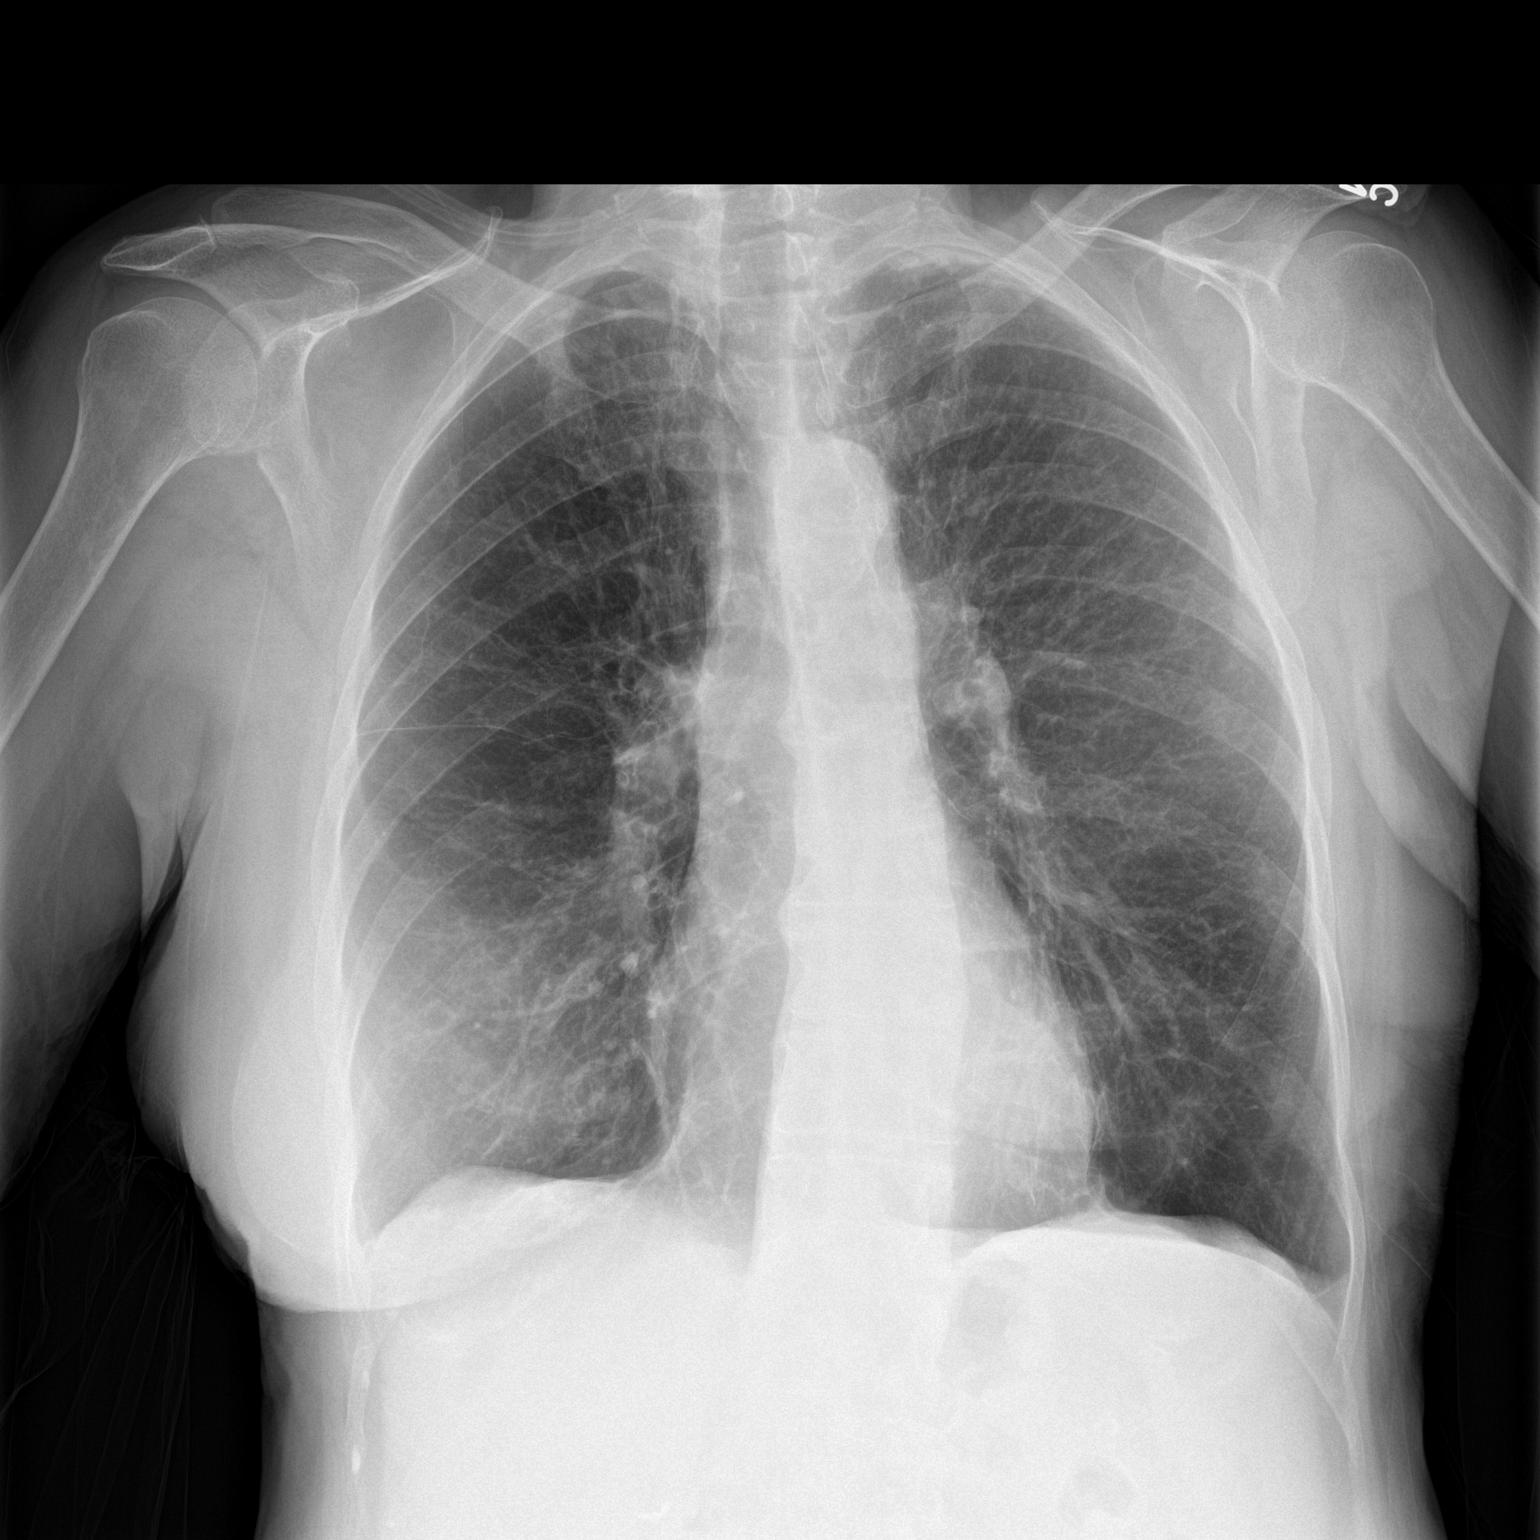
[im 2/2]
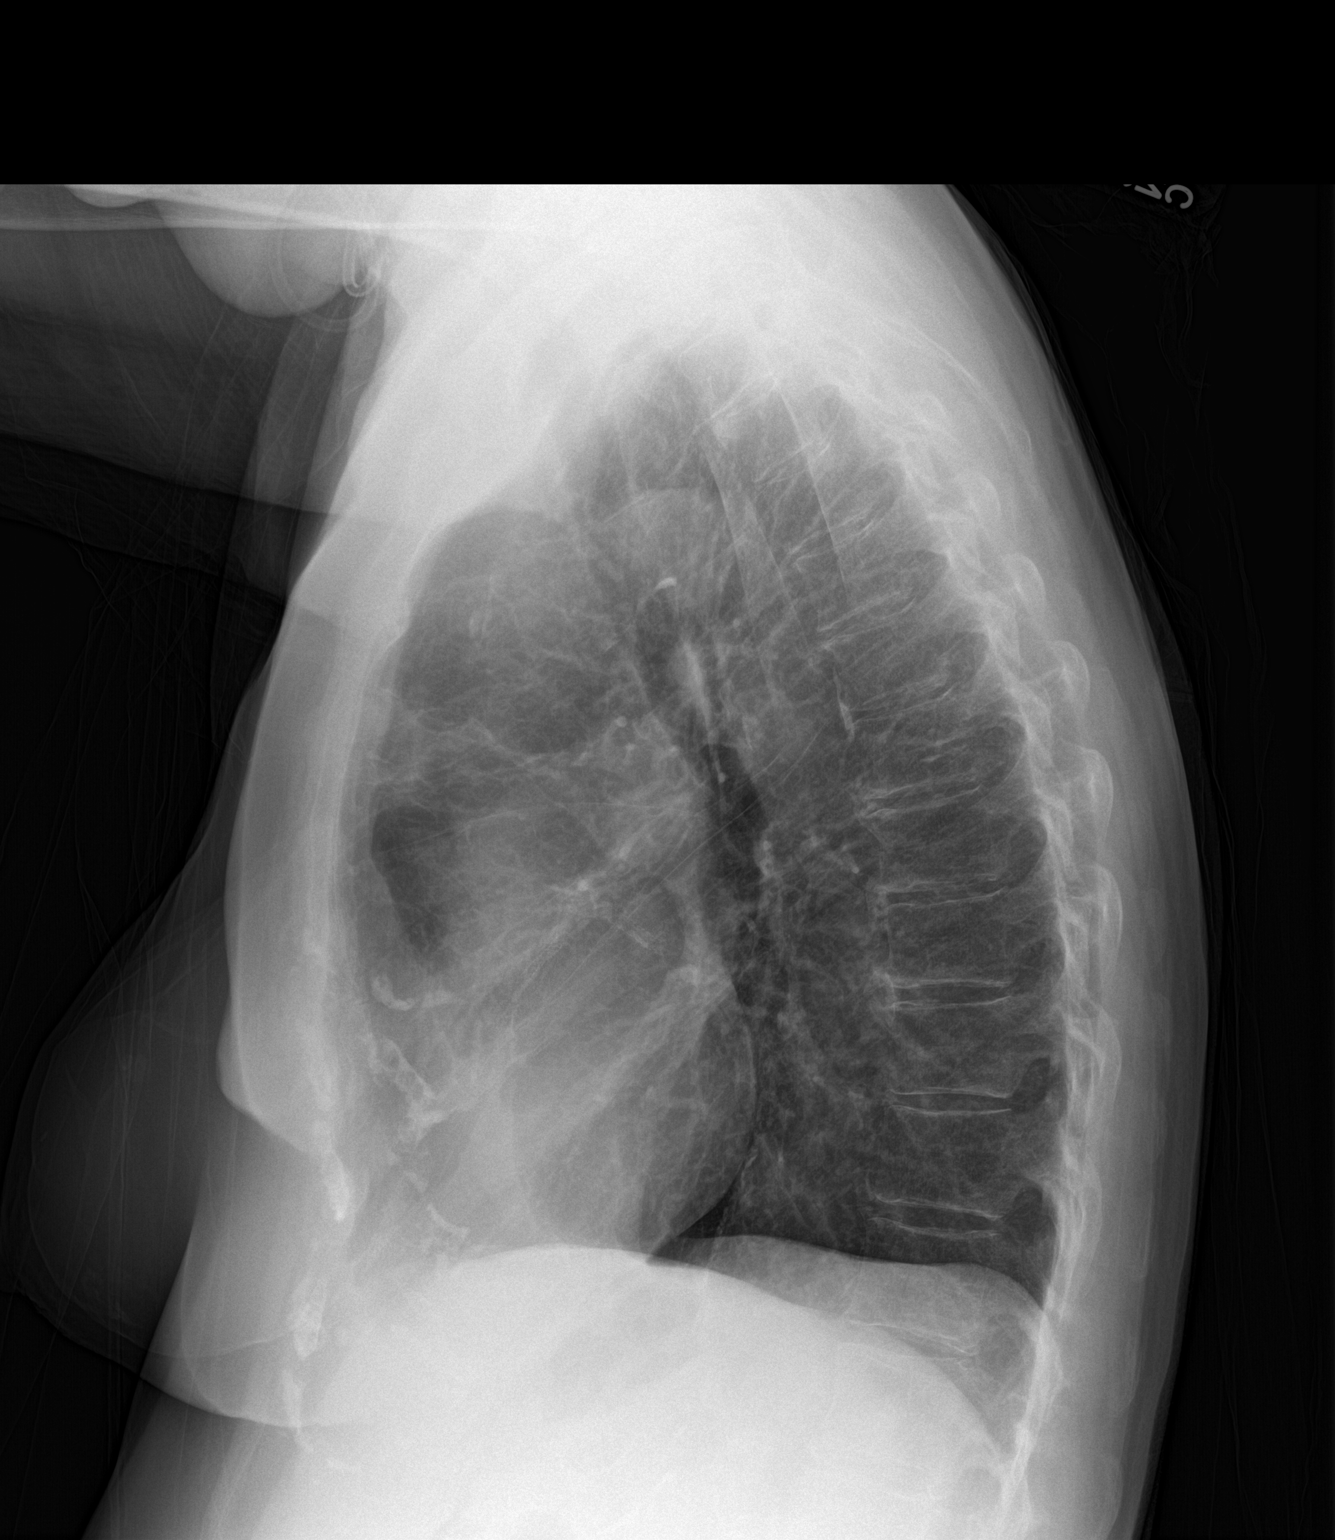

[2 of 2 positions shown; findings below may reference images not displayed]

FINDINGS: Lungs are somewhat hyperexpanded. There is scarring in the left
base. There is no edema or consolidation. Heart size is within
normal limits. There is diminished vascularity in the upper lobes,
felt to be secondary to a degree of underlying COPD. No adenopathy.
There is atherosclerotic change in the aorta. There are foci of
coronary calcification in the left anterior descending and
circumflex coronary arteries. No bone lesions.
IMPRESSION: Evidence of a degree of underlying emphysematous change, stable.
Stable scarring left base. No edema or consolidation. Extensive
coronary artery calcification identified.

## 2016-04-22 DIAGNOSIS — K222 Esophageal obstruction: Secondary | ICD-10-CM | POA: Diagnosis not present

## 2016-04-22 DIAGNOSIS — R000 Tachycardia, unspecified: Secondary | ICD-10-CM | POA: Diagnosis not present

## 2016-04-22 DIAGNOSIS — R1310 Dysphagia, unspecified: Secondary | ICD-10-CM | POA: Diagnosis not present

## 2016-04-22 DIAGNOSIS — J440 Chronic obstructive pulmonary disease with acute lower respiratory infection: Secondary | ICD-10-CM | POA: Diagnosis not present

## 2016-05-07 DIAGNOSIS — Z23 Encounter for immunization: Secondary | ICD-10-CM | POA: Diagnosis not present

## 2016-05-07 DIAGNOSIS — J440 Chronic obstructive pulmonary disease with acute lower respiratory infection: Secondary | ICD-10-CM | POA: Diagnosis not present

## 2016-05-07 DIAGNOSIS — E271 Primary adrenocortical insufficiency: Secondary | ICD-10-CM | POA: Diagnosis not present

## 2016-05-07 DIAGNOSIS — R1310 Dysphagia, unspecified: Secondary | ICD-10-CM | POA: Diagnosis not present

## 2016-05-12 DIAGNOSIS — Q395 Congenital dilatation of esophagus: Secondary | ICD-10-CM | POA: Diagnosis not present

## 2016-05-12 DIAGNOSIS — Q8789 Other specified congenital malformation syndromes, not elsewhere classified: Secondary | ICD-10-CM | POA: Diagnosis not present

## 2016-05-12 DIAGNOSIS — E271 Primary adrenocortical insufficiency: Secondary | ICD-10-CM | POA: Diagnosis not present

## 2016-05-12 DIAGNOSIS — H0489 Other disorders of lacrimal system: Secondary | ICD-10-CM | POA: Diagnosis not present

## 2016-05-14 DIAGNOSIS — J449 Chronic obstructive pulmonary disease, unspecified: Secondary | ICD-10-CM | POA: Diagnosis not present

## 2016-06-13 DIAGNOSIS — J449 Chronic obstructive pulmonary disease, unspecified: Secondary | ICD-10-CM | POA: Diagnosis not present

## 2016-06-16 ENCOUNTER — Emergency Department: Payer: Medicare Other

## 2016-06-16 ENCOUNTER — Inpatient Hospital Stay
Admission: EM | Admit: 2016-06-16 | Discharge: 2016-06-18 | DRG: 189 | Disposition: A | Discharge status: Home / Self Care | Payer: Medicare Other | Attending: Internal Medicine | Admitting: Internal Medicine

## 2016-06-16 ENCOUNTER — Encounter: Payer: Self-pay | Admitting: Emergency Medicine

## 2016-06-16 DIAGNOSIS — R0602 Shortness of breath: Secondary | ICD-10-CM | POA: Diagnosis not present

## 2016-06-16 DIAGNOSIS — I10 Essential (primary) hypertension: Secondary | ICD-10-CM | POA: Diagnosis present

## 2016-06-16 DIAGNOSIS — M6281 Muscle weakness (generalized): Secondary | ICD-10-CM

## 2016-06-16 DIAGNOSIS — I129 Hypertensive chronic kidney disease with stage 1 through stage 4 chronic kidney disease, or unspecified chronic kidney disease: Secondary | ICD-10-CM | POA: Diagnosis not present

## 2016-06-16 DIAGNOSIS — N183 Chronic kidney disease, stage 3 unspecified: Secondary | ICD-10-CM | POA: Diagnosis present

## 2016-06-16 DIAGNOSIS — F419 Anxiety disorder, unspecified: Secondary | ICD-10-CM | POA: Diagnosis present

## 2016-06-16 DIAGNOSIS — Z79899 Other long term (current) drug therapy: Secondary | ICD-10-CM

## 2016-06-16 DIAGNOSIS — J441 Chronic obstructive pulmonary disease with (acute) exacerbation: Secondary | ICD-10-CM | POA: Diagnosis present

## 2016-06-16 DIAGNOSIS — J9621 Acute and chronic respiratory failure with hypoxia: Principal | ICD-10-CM | POA: Diagnosis present

## 2016-06-16 DIAGNOSIS — Z9889 Other specified postprocedural states: Secondary | ICD-10-CM | POA: Diagnosis not present

## 2016-06-16 DIAGNOSIS — Z8701 Personal history of pneumonia (recurrent): Secondary | ICD-10-CM | POA: Diagnosis not present

## 2016-06-16 DIAGNOSIS — D72829 Elevated white blood cell count, unspecified: Secondary | ICD-10-CM | POA: Diagnosis present

## 2016-06-16 DIAGNOSIS — R000 Tachycardia, unspecified: Secondary | ICD-10-CM | POA: Diagnosis present

## 2016-06-16 DIAGNOSIS — E871 Hypo-osmolality and hyponatremia: Secondary | ICD-10-CM | POA: Diagnosis present

## 2016-06-16 DIAGNOSIS — Z7902 Long term (current) use of antithrombotics/antiplatelets: Secondary | ICD-10-CM | POA: Diagnosis not present

## 2016-06-16 DIAGNOSIS — Z882 Allergy status to sulfonamides status: Secondary | ICD-10-CM

## 2016-06-16 DIAGNOSIS — K219 Gastro-esophageal reflux disease without esophagitis: Secondary | ICD-10-CM | POA: Diagnosis present

## 2016-06-16 DIAGNOSIS — R262 Difficulty in walking, not elsewhere classified: Secondary | ICD-10-CM

## 2016-06-16 DIAGNOSIS — I2510 Atherosclerotic heart disease of native coronary artery without angina pectoris: Secondary | ICD-10-CM | POA: Diagnosis not present

## 2016-06-16 DIAGNOSIS — Z90710 Acquired absence of both cervix and uterus: Secondary | ICD-10-CM

## 2016-06-16 DIAGNOSIS — R05 Cough: Secondary | ICD-10-CM | POA: Diagnosis not present

## 2016-06-16 LAB — COMPREHENSIVE METABOLIC PANEL
ALK PHOS: 110 U/L (ref 38–126)
ALT: 11 U/L — ABNORMAL LOW (ref 14–54)
ANION GAP: 11 (ref 5–15)
AST: 18 U/L (ref 15–41)
Albumin: 3.9 g/dL (ref 3.5–5.0)
BILIRUBIN TOTAL: 0.9 mg/dL (ref 0.3–1.2)
BUN: 26 mg/dL — ABNORMAL HIGH (ref 6–20)
CALCIUM: 9.2 mg/dL (ref 8.9–10.3)
CO2: 23 mmol/L (ref 22–32)
Chloride: 95 mmol/L — ABNORMAL LOW (ref 101–111)
Creatinine, Ser: 1.29 mg/dL — ABNORMAL HIGH (ref 0.44–1.00)
GFR calc non Af Amer: 40 mL/min — ABNORMAL LOW (ref >60)
GFR, EST AFRICAN AMERICAN: 46 mL/min — AB (ref >60)
Glucose, Bld: 105 mg/dL — ABNORMAL HIGH (ref 65–99)
Potassium: 4 mmol/L (ref 3.5–5.1)
Sodium: 129 mmol/L — ABNORMAL LOW (ref 135–145)
TOTAL PROTEIN: 7.9 g/dL (ref 6.5–8.1)

## 2016-06-16 LAB — CBC WITH DIFFERENTIAL/PLATELET
Basophils Absolute: 0.2 10*3/uL — ABNORMAL HIGH (ref 0–0.1)
Basophils Relative: 1 %
EOS ABS: 0 10*3/uL (ref 0–0.7)
Eosinophils Relative: 0 %
HEMATOCRIT: 33.6 % — AB (ref 35.0–47.0)
HEMOGLOBIN: 11 g/dL — AB (ref 12.0–16.0)
LYMPHS ABS: 2.6 10*3/uL (ref 1.0–3.6)
Lymphocytes Relative: 18 %
MCH: 29 pg (ref 26.0–34.0)
MCHC: 32.8 g/dL (ref 32.0–36.0)
MCV: 88.4 fL (ref 80.0–100.0)
MONOS PCT: 7 %
Monocytes Absolute: 1.1 10*3/uL — ABNORMAL HIGH (ref 0.2–0.9)
NEUTROS ABS: 11.1 10*3/uL — AB (ref 1.4–6.5)
NEUTROS PCT: 74 %
Platelets: 360 10*3/uL (ref 150–440)
RBC: 3.8 MIL/uL (ref 3.80–5.20)
RDW: 15.3 % — ABNORMAL HIGH (ref 11.5–14.5)
WBC: 15 10*3/uL — ABNORMAL HIGH (ref 3.6–11.0)

## 2016-06-16 LAB — TROPONIN I: Troponin I: <0.03 ng/mL (ref <0.03)

## 2016-06-16 MED ORDER — IPRATROPIUM-ALBUTEROL 0.5-2.5 (3) MG/3ML IN SOLN
3.0000 mL | Freq: Once | RESPIRATORY_TRACT | Status: AC
  Administered 2016-06-16: 3 mL via RESPIRATORY_TRACT
  Filled 2016-06-16: qty 3

## 2016-06-16 MED ORDER — METHYLPREDNISOLONE SODIUM SUCC 125 MG IJ SOLR
125.0000 mg | Freq: Once | INTRAMUSCULAR | Status: AC
  Administered 2016-06-16: 125 mg via INTRAVENOUS
  Filled 2016-06-16: qty 2

## 2016-06-16 NOTE — ED Provider Notes (Signed)
[facility] Emergency Department Provider Note   ____________________________________________    I have reviewed the triage vital signs and the nursing notes.   HISTORY  Chief Complaint Cough and COPD     HPI Carolyn Lewis is a 75 y.o. female who presents with complaints of cough and increasing shortness of breath over the last 4 days. Patient is a history of COPD and is on oxygen chronically, she uses 2 L when at rest up to 4 when active. She denies chest pain, no fevers. No calf pain or swelling. She reports a history of pneumonia.   Past Medical History:  Diagnosis Date  . Acute MI   . Anxiety   . CKD stage G3b/A3, GFR 30 - 44 and albumin creatinine ratio >300 mg/g   . COPD (chronic obstructive pulmonary disease) (HCC)   . COPD (chronic obstructive pulmonary disease) (HCC)   . GERD (gastroesophageal reflux disease)   . Hypertension     Patient Active Problem List   Diagnosis Date Noted  . Fever 02/06/2015    Past Surgical History:  Procedure Laterality Date  . ABDOMINAL HYSTERECTOMY    . CARDIAC CATHETERIZATION  2011    Prior to Admission medications   Medication Sig Start Date End Date Taking? Authorizing Provider  acetaminophen-codeine (TYLENOL #2) 300-15 MG per tablet Take 1 tablet by mouth every 4 (four) hours as needed for moderate pain. 02/07/15   Adrian SaranSital Mody, MD  amitriptyline (ELAVIL) 25 MG tablet Take 25 mg by mouth at bedtime.    Historical Provider, MD  amLODipine (NORVASC) 5 MG tablet Take 5 mg by mouth daily.    Historical Provider, MD  amoxicillin-clavulanate (AUGMENTIN) 875-125 MG per tablet Take 1 tablet by mouth every 12 (twelve) hours. 02/03/15   Adrian SaranSital Mody, MD  atorvastatin (LIPITOR) 40 MG tablet Take 40 mg by mouth daily.    Historical Provider, MD  clopidogrel (PLAVIX) 75 MG tablet Take 75 mg by mouth daily.    Historical Provider, MD  diazepam (VALIUM) 2 MG tablet Take 2 mg by mouth 2 (two) times daily.     Historical Provider, MD  enalapril (VASOTEC) 2.5 MG tablet Take 2.5 mg by mouth daily.    Historical Provider, MD  famotidine (PEPCID) 20 MG tablet Take 20 mg by mouth daily.    Historical Provider, MD  furosemide (LASIX) 20 MG tablet Take 20 mg by mouth once. Patient only takes Monday, Wednesday, and Friday.    Historical Provider, MD  Ipratropium-Albuterol (COMBIVENT RESPIMAT) 20-100 MCG/ACT AERS respimat Inhale 1 puff into the lungs 3 (three) times daily as needed for wheezing or shortness of breath.    Historical Provider, MD  levalbuterol Pauline Aus(XOPENEX) 1.25 MG/3ML nebulizer solution Take 1.25 mg by nebulization every 4 (four) hours as needed for wheezing or shortness of breath.    Historical Provider, MD  methylPREDNISolone (MEDROL) 4 MG tablet Take 1 tablet (4 mg total) by mouth daily. MEDROL doe pak taper over 6 days as Rx 02/07/15   Adrian SaranSital Mody, MD  metoprolol (LOPRESSOR) 50 MG tablet Take 1 tablet (50 mg total) by mouth 2 (two) times daily. 02/03/15   Adrian SaranSital Mody, MD  nitroGLYCERIN (NITROSTAT) 0.4 MG SL tablet Place 0.4 mg under the tongue every 5 (five) minutes as needed for chest pain.    Historical Provider, MD  pantoprazole (PROTONIX) 40 MG tablet Take 40 mg by mouth daily.    Historical Provider, MD  RANEXA 500 MG 12 hr tablet Take 500  mg by mouth 2 (two) times daily.  01/19/15   Historical Provider, MD  SUMAtriptan (IMITREX) 25 MG tablet Take 25 mg by mouth every 2 (two) hours as needed.  12/31/14   Historical Provider, MD  tobramycin (TOBREX) 0.3 % ophthalmic solution 1 drop 2 (two) times daily. 1 drop into affected eye twice daily 01/19/15   Historical Provider, MD  traMADol (ULTRAM) 50 MG tablet Take 50 mg by mouth 2 (two) times daily.    Historical Provider, MD     Allergies Sulfa antibiotics  History reviewed. No pertinent family history.  Social History Social History  Substance Use Topics  . Smoking status: Never Smoker  . Smokeless tobacco: Not on file  . Alcohol use No     Review of Systems  Constitutional: No fever/chills Eyes: No visual changes.  ENT: No throat swelling Cardiovascular: As above Respiratory: As above Gastrointestinal: No abdominal pain.  No nausea, no vomiting.   Genitourinary: Negative for dysuria. Musculoskeletal: Negative for back pain. Skin: Negative for rash. Neurological: Negative for headaches or weakness  10-point ROS otherwise negative.  ____________________________________________   PHYSICAL EXAM:  VITAL SIGNS: ED Triage Vitals  Enc Vitals Group     BP 06/16/16 1911 112/83     Pulse Rate 06/16/16 1911 (!) 118     Resp 06/16/16 1911 18     Temp 06/16/16 1911 98.3 F (36.8 C)     Temp Source 06/16/16 1911 Oral     SpO2 06/16/16 1911 96 %     Weight 06/16/16 1912 134 lb 7.7 oz (61 kg)     Height 06/16/16 1912 5\' 6"  (1.676 m)     Head Circumference --      Peak Flow --      Pain Score --      Pain Loc --      Pain Edu? --      Excl. in GC? --     Constitutional: Alert and oriented. No acute distress. Pleasant and interactive Eyes: Conjunctivae are normal.   Nose: No congestion/rhinnorhea. Mouth/Throat: Mucous membranes are moist.  Pharynx normal  Cardiovascular: Normal rate, regular rhythm. Grossly normal heart sounds.  Good peripheral circulation. Respiratory: Increased respiratory effort, tachypnea, wheezing diffusely Gastrointestinal: Soft and nontender. No distention.  No CVA tenderness. Genitourinary: deferred Musculoskeletal: No lower extremity tenderness nor edema.  Warm and well perfused Neurologic:  Normal speech and language. No gross focal neurologic deficits are appreciated.  Skin:  Skin is warm, dry and intact. No rash noted. Psychiatric: Mood and affect are normal. Speech and behavior are normal.  ____________________________________________   LABS (all labs ordered are listed, but only abnormal results are displayed)  Labs Reviewed  CBC WITH DIFFERENTIAL/PLATELET - Abnormal;  Notable for the following:       Result Value   WBC 15.0 (*)    Hemoglobin 11.0 (*)    HCT 33.6 (*)    RDW 15.3 (*)    Neutro Abs 11.1 (*)    Monocytes Absolute 1.1 (*)    Basophils Absolute 0.2 (*)    All other components within normal limits  COMPREHENSIVE METABOLIC PANEL - Abnormal; Notable for the following:    Sodium 129 (*)    Chloride 95 (*)    Glucose, Bld 105 (*)    BUN 26 (*)    Creatinine, Ser 1.29 (*)    ALT 11 (*)    GFR calc non Af Amer 40 (*)    GFR calc Af Amer 46 (*)  All other components within normal limits  CULTURE, BLOOD (ROUTINE X 2)  CULTURE, BLOOD (ROUTINE X 2)  TROPONIN I   ____________________________________________  EKG  ED ECG REPORT I, Jene Every, the attending physician, personally viewed and interpreted this ECG.  Date: 06/16/2016 EKG Time: 7:09 PM Rate: 118 Rhythm: Sinus tachycardia QRS Axis: normal Intervals: normal ST/T Wave abnormalities: normal Conduction Disturbances: LAFB  ____________________________________________  RADIOLOGY  Chest x-ray unremarkable ____________________________________________   PROCEDURES  Procedure(s) performed: No    Critical Care performed: No ____________________________________________   INITIAL IMPRESSION / ASSESSMENT AND PLAN / ED COURSE  Pertinent labs & imaging results that were available during my care of the patient were reviewed by me and considered in my medical decision making (see chart for details).  Patient presents with cough, shortness of breath. This may be related to COPD but also may represent an underlying pneumonia. Imaging and labs pending, we will treat with Solu-Medrol and nebs  Clinical Course  ----------------------------------------- 9:25 PM on 06/16/2016 -----------------------------------------  Patient has had improvement in wheezing, however she is still persistently tachycardic and requiring 4 L nasal cannula, typically she uses 2 L at rest. I  will admit to the hospitalist for further management ____________________________________________   FINAL CLINICAL IMPRESSION(S) / ED DIAGNOSES  Final diagnoses:  COPD exacerbation (HCC)      NEW MEDICATIONS STARTED DURING THIS VISIT:  New Prescriptions   No medications on file     Note:  This document was prepared using Dragon voice recognition software and may include unintentional dictation errors.    Jene Every, MD 06/16/16 2125

## 2016-06-16 NOTE — ED Triage Notes (Signed)
Pt presents from home with cough and underlying COPD; has increased heart rate on arrival. Alert and oriented x4

## 2016-06-16 NOTE — ED Notes (Signed)
Notified Dr. Cyril LoosenKinner of increasing heart rate; Provider gave no further orders

## 2016-06-16 NOTE — ED Notes (Signed)
Assisted patient back into bed. Patient was unable to urinate at this time. Provided patient with warm blankets

## 2016-06-16 NOTE — H&P (Signed)
[facility] - [place] at [facility]   PATIENT NAME: Carolyn Lewis    MR#:  [id number]  DATE OF BIRTH:  [birth year]  DATE OF ADMISSION:  06/16/2016  PRIMARY CARE PHYSICIAN: Corky Downs, MD   REQUESTING/REFERRING PHYSICIAN: Cyril Loosen, MD  CHIEF COMPLAINT:   Chief Complaint  Patient presents with  . Cough  . COPD    HISTORY OF PRESENT ILLNESS:  Carolyn Lewis  is a 75 y.o. female who presents with Acute onset shortness of breath requiring increase in her baseline oxygen use. Patient is a known history of COPD and states that early this morning she became short of breath. This is a setting of upper respiratory symptoms which started a couple days ago. She had to increase her oxygen from 2 L to 4 L/m. She came to the ED for evaluation and was felt to be in COPD exacerbation. Chest x-ray did not show pneumonia. She was tachycardic when she got here and mildly hypoxic, which corrected with the increased oxygen as stated previously. She has mild leukocytosis home but does have steroid Dosepak listed on her home meds. Hospitals were called for admission  PAST MEDICAL HISTORY:   Past Medical History:  Diagnosis Date  . Acute MI   . Anxiety   . CKD stage G3b/A3, GFR 30 - 44 and albumin creatinine ratio >300 mg/g   . COPD (chronic obstructive pulmonary disease) (HCC)   . GERD (gastroesophageal reflux disease)   . Hypertension     PAST SURGICAL HISTORY:   Past Surgical History:  Procedure Laterality Date  . ABDOMINAL HYSTERECTOMY    . CARDIAC CATHETERIZATION  2011    SOCIAL HISTORY:   Social History  Substance Use Topics  . Smoking status: Never Smoker  . Smokeless tobacco: Not on file  . Alcohol use No    FAMILY HISTORY:  History reviewed. No pertinent family history.  DRUG ALLERGIES:   Allergies  Allergen Reactions  . Sulfa Antibiotics Rash    MEDICATIONS AT HOME:   Prior to Admission medications   Medication Sig Start Date End Date Taking?  Authorizing Provider  acetaminophen-codeine (TYLENOL #2) 300-15 MG per tablet Take 1 tablet by mouth every 4 (four) hours as needed for moderate pain. 02/07/15   Adrian Saran, MD  amitriptyline (ELAVIL) 25 MG tablet Take 25 mg by mouth at bedtime.    Historical Provider, MD  amLODipine (NORVASC) 5 MG tablet Take 5 mg by mouth daily.    Historical Provider, MD  amoxicillin-clavulanate (AUGMENTIN) 875-125 MG per tablet Take 1 tablet by mouth every 12 (twelve) hours. 02/03/15   Adrian Saran, MD  atorvastatin (LIPITOR) 40 MG tablet Take 40 mg by mouth daily.    Historical Provider, MD  clopidogrel (PLAVIX) 75 MG tablet Take 75 mg by mouth daily.    Historical Provider, MD  diazepam (VALIUM) 2 MG tablet Take 2 mg by mouth 2 (two) times daily.    Historical Provider, MD  enalapril (VASOTEC) 2.5 MG tablet Take 2.5 mg by mouth daily.    Historical Provider, MD  famotidine (PEPCID) 20 MG tablet Take 20 mg by mouth daily.    Historical Provider, MD  furosemide (LASIX) 20 MG tablet Take 20 mg by mouth once. Patient only takes Monday, Wednesday, and Friday.    Historical Provider, MD  Ipratropium-Albuterol (COMBIVENT RESPIMAT) 20-100 MCG/ACT AERS respimat Inhale 1 puff into the lungs 3 (three) times daily as needed for wheezing or shortness of breath.    Historical Provider, MD  levalbuterol (XOPENEX) 1.25 MG/3ML nebulizer solution Take 1.25 mg by nebulization every 4 (four) hours as needed for wheezing or shortness of breath.    Historical Provider, MD  methylPREDNISolone (MEDROL) 4 MG tablet Take 1 tablet (4 mg total) by mouth daily. MEDROL doe pak taper over 6 days as Rx 02/07/15   Adrian SaranSital Mody, MD  metoprolol (LOPRESSOR) 50 MG tablet Take 1 tablet (50 mg total) by mouth 2 (two) times daily. 02/03/15   Adrian SaranSital Mody, MD  nitroGLYCERIN (NITROSTAT) 0.4 MG SL tablet Place 0.4 mg under the tongue every 5 (five) minutes as needed for chest pain.    Historical Provider, MD  pantoprazole (PROTONIX) 40 MG tablet Take 40 mg by  mouth daily.    Historical Provider, MD  RANEXA 500 MG 12 hr tablet Take 500 mg by mouth 2 (two) times daily.  01/19/15   Historical Provider, MD  SUMAtriptan (IMITREX) 25 MG tablet Take 25 mg by mouth every 2 (two) hours as needed.  12/31/14   Historical Provider, MD  tobramycin (TOBREX) 0.3 % ophthalmic solution 1 drop 2 (two) times daily. 1 drop into affected eye twice daily 01/19/15   Historical Provider, MD  traMADol (ULTRAM) 50 MG tablet Take 50 mg by mouth 2 (two) times daily.    Historical Provider, MD    REVIEW OF SYSTEMS:  Review of Systems  Constitutional: Negative for chills, fever, malaise/fatigue and weight loss.  HENT: Negative for ear pain, hearing loss and tinnitus.   Eyes: Negative for blurred vision, double vision, pain and redness.  Respiratory: Positive for cough, shortness of breath and wheezing. Negative for hemoptysis.   Cardiovascular: Negative for chest pain, palpitations, orthopnea and leg swelling.  Gastrointestinal: Negative for abdominal pain, constipation, diarrhea, nausea and vomiting.  Genitourinary: Negative for dysuria, frequency and hematuria.  Musculoskeletal: Negative for back pain, joint pain and neck pain.  Skin:       No acne, rash, or lesions  Neurological: Negative for dizziness, tremors, focal weakness and weakness.  Endo/Heme/Allergies: Negative for polydipsia. Does not bruise/bleed easily.  Psychiatric/Behavioral: Negative for depression. The patient is not nervous/anxious and does not have insomnia.      VITAL SIGNS:   Vitals:   06/16/16 2030 06/16/16 2130 06/16/16 2200 06/16/16 2230  BP: (!) 119/57 103/73 106/60 116/62  Pulse: (!) 121 (!) 125 (!) 119 (!) 117  Resp: 19 (!) 23 (!) 23 (!) 21  Temp:      TempSrc:      SpO2: 100% 94% 95% 91%  Weight:      Height:       Wt Readings from Last 3 Encounters:  06/16/16 61 kg (134 lb 7.7 oz)  02/06/15 65.8 kg (145 lb)  02/02/15 74.4 kg (164 lb)    PHYSICAL EXAMINATION:  Physical Exam   Vitals reviewed. Constitutional: She is oriented to person, place, and time. She appears well-developed and well-nourished. No distress.  HENT:  Head: Normocephalic and atraumatic.  Mouth/Throat: Oropharynx is clear and moist.  Eyes: Conjunctivae and EOM are normal. Pupils are equal, round, and reactive to light. No scleral icterus.  Neck: Normal range of motion. Neck supple. No JVD present. No thyromegaly present.  Cardiovascular: Regular rhythm and intact distal pulses.  Exam reveals no gallop and no friction rub.   No murmur heard. Tachycardic  Respiratory: Effort normal. No respiratory distress. She has wheezes. She has no rales.  GI: Soft. Bowel sounds are normal. She exhibits no distension. There is no tenderness.  Musculoskeletal:  Normal range of motion. She exhibits no edema.  No arthritis, no gout  Lymphadenopathy:    She has no cervical adenopathy.  Neurological: She is alert and oriented to person, place, and time. No cranial nerve deficit.  No dysarthria, no aphasia  Skin: Skin is warm and dry. No rash noted. No erythema.  Psychiatric: She has a normal mood and affect. Her behavior is normal. Judgment and thought content normal.    LABORATORY PANEL:   CBC  Recent Labs Lab 06/16/16 1923  WBC 15.0*  HGB 11.0*  HCT 33.6*  PLT 360   ------------------------------------------------------------------------------------------------------------------  Chemistries   Recent Labs Lab 06/16/16 1923  NA 129*  K 4.0  CL 95*  CO2 23  GLUCOSE 105*  BUN 26*  CREATININE 1.29*  CALCIUM 9.2  AST 18  ALT 11*  ALKPHOS 110  BILITOT 0.9   ------------------------------------------------------------------------------------------------------------------  Cardiac Enzymes  Recent Labs Lab 06/16/16 1923  TROPONINI <0.03   ------------------------------------------------------------------------------------------------------------------  RADIOLOGY:  Dg Chest Portable 1  View  Result Date: 06/16/2016 CLINICAL DATA:  Productive cough and shortness of breath.  COPD. EXAM: PORTABLE CHEST 1 VIEW COMPARISON:  03/23/2015 FINDINGS: The patient is mildly rotated to the right. Cardiomediastinal silhouette is unchanged within this limitation. The lungs remain hyperinflated without evidence of confluent airspace opacity, edema, pleural effusion, or pneumothorax. Thoracic spondylosis is noted. IMPRESSION: COPD without evidence of acute cardiopulmonary process. Electronically Signed   By: Sebastian Ache M.D.   On: 06/16/2016 19:57    EKG:   Orders placed or performed during the hospital encounter of 06/16/16  . EKG 12-Lead  . EKG 12-Lead    IMPRESSION AND PLAN:  Principal Problem:   COPD exacerbation (HCC) - IV steroids, when necessary duo nebs, azithromycin Active Problems:   Leukocytosis - we are adding a UA onto her initial workup, blood cultures were sent from the ED. No pneumonia seen on chest x-ray.   Anxiety - continue home meds   HTN (hypertension) - continue home meds   GERD (gastroesophageal reflux disease) - home dose PPI   CKD (chronic kidney disease), stage III - stable, actually improved since last check, avoid nephrotoxins  All the records are reviewed and case discussed with ED provider. Management plans discussed with the patient and/or family.  DVT PROPHYLAXIS: SubQ heparin  GI PROPHYLAXIS: PPI  ADMISSION STATUS: Inpatient  CODE STATUS: Full Code Status History    Date Active Date Inactive Code Status Order ID Comments User Context   02/06/2015  3:10 PM 02/07/2015  4:31 PM Full Code [id number]  Adrian Saran, MD Inpatient   01/31/2015  2:35 PM 02/03/2015  4:04 PM Full Code [id number]  Adrian Saran, MD ED      TOTAL TIME TAKING CARE OF THIS PATIENT: 45 minutes.    Analyssa Downs FIELDING 06/16/2016, 10:38 PM  [facility]  Office  [phone]  CC: Primary care physician; Corky Downs, MD

## 2016-06-17 LAB — URINALYSIS COMPLETE WITH MICROSCOPIC (ARMC ONLY)
BILIRUBIN URINE: NEGATIVE
Bacteria, UA: NONE SEEN
GLUCOSE, UA: NEGATIVE mg/dL
HGB URINE DIPSTICK: NEGATIVE
Ketones, ur: NEGATIVE mg/dL
Leukocytes, UA: NEGATIVE
NITRITE: NEGATIVE
Protein, ur: NEGATIVE mg/dL
Specific Gravity, Urine: 1.008 (ref 1.005–1.030)
pH: 5 (ref 5.0–8.0)

## 2016-06-17 LAB — BASIC METABOLIC PANEL
Anion gap: 12 (ref 5–15)
BUN: 25 mg/dL — AB (ref 6–20)
CHLORIDE: 95 mmol/L — AB (ref 101–111)
CO2: 25 mmol/L (ref 22–32)
CREATININE: 1.49 mg/dL — AB (ref 0.44–1.00)
Calcium: 9.6 mg/dL (ref 8.9–10.3)
GFR calc Af Amer: 39 mL/min — ABNORMAL LOW (ref >60)
GFR calc non Af Amer: 33 mL/min — ABNORMAL LOW (ref >60)
Glucose, Bld: 146 mg/dL — ABNORMAL HIGH (ref 65–99)
POTASSIUM: 4.3 mmol/L (ref 3.5–5.1)
Sodium: 132 mmol/L — ABNORMAL LOW (ref 135–145)

## 2016-06-17 LAB — CBC
HEMATOCRIT: 32 % — AB (ref 35.0–47.0)
Hemoglobin: 10.8 g/dL — ABNORMAL LOW (ref 12.0–16.0)
MCH: 29.5 pg (ref 26.0–34.0)
MCHC: 33.6 g/dL (ref 32.0–36.0)
MCV: 87.8 fL (ref 80.0–100.0)
PLATELETS: 359 10*3/uL (ref 150–440)
RBC: 3.65 MIL/uL — AB (ref 3.80–5.20)
RDW: 16 % — ABNORMAL HIGH (ref 11.5–14.5)
WBC: 16.1 10*3/uL — AB (ref 3.6–11.0)

## 2016-06-17 LAB — MAGNESIUM: Magnesium: 2 mg/dL (ref 1.7–2.4)

## 2016-06-17 LAB — PROCALCITONIN: Procalcitonin: 0.15 ng/mL

## 2016-06-17 MED ORDER — ACETAMINOPHEN 650 MG RE SUPP: 650.0000 mg | Freq: Four times a day (QID) | RECTAL | Status: DC | PRN

## 2016-06-17 MED ORDER — ACETAMINOPHEN 325 MG PO TABS
650.0000 mg | ORAL_TABLET | Freq: Four times a day (QID) | ORAL | Status: DC | PRN
  Administered 2016-06-17 – 2016-06-18 (×2): 650 mg via ORAL
  Filled 2016-06-17 (×2): qty 2

## 2016-06-17 MED ORDER — FUROSEMIDE 20 MG PO TABS
20.0000 mg | ORAL_TABLET | Freq: Once | ORAL | Status: AC
  Administered 2016-06-17: 01:00:00 20 mg via ORAL
  Filled 2016-06-17: qty 1

## 2016-06-17 MED ORDER — CLOPIDOGREL BISULFATE 75 MG PO TABS
75.0000 mg | ORAL_TABLET | Freq: Every day | ORAL | Status: DC
  Administered 2016-06-17 – 2016-06-18 (×2): 75 mg via ORAL
  Filled 2016-06-17 (×2): qty 1

## 2016-06-17 MED ORDER — METOPROLOL TARTRATE 50 MG PO TABS
50.0000 mg | ORAL_TABLET | Freq: Two times a day (BID) | ORAL | Status: DC
  Administered 2016-06-17 – 2016-06-18 (×4): 50 mg via ORAL
  Filled 2016-06-17 (×4): qty 1

## 2016-06-17 MED ORDER — AMITRIPTYLINE HCL 25 MG PO TABS
25.0000 mg | ORAL_TABLET | Freq: Every day | ORAL | Status: DC
  Administered 2016-06-17: 20:00:00 25 mg via ORAL
  Filled 2016-06-17: qty 1

## 2016-06-17 MED ORDER — IPRATROPIUM-ALBUTEROL 0.5-2.5 (3) MG/3ML IN SOLN: 3.0000 mL | RESPIRATORY_TRACT | Status: DC | PRN

## 2016-06-17 MED ORDER — TRAMADOL HCL 50 MG PO TABS
50.0000 mg | ORAL_TABLET | Freq: Two times a day (BID) | ORAL | Status: DC | PRN
  Administered 2016-06-17: 16:00:00 50 mg via ORAL
  Filled 2016-06-17: qty 1

## 2016-06-17 MED ORDER — SODIUM CHLORIDE 0.9% FLUSH
3.0000 mL | Freq: Two times a day (BID) | INTRAVENOUS | Status: DC
  Administered 2016-06-17 – 2016-06-18 (×4): 3 mL via INTRAVENOUS

## 2016-06-17 MED ORDER — ENSURE ENLIVE PO LIQD
237.0000 mL | Freq: Two times a day (BID) | ORAL | Status: DC
  Administered 2016-06-17: 237 mL via ORAL

## 2016-06-17 MED ORDER — AMLODIPINE BESYLATE 5 MG PO TABS
5.0000 mg | ORAL_TABLET | Freq: Every day | ORAL | Status: DC
  Administered 2016-06-17 – 2016-06-18 (×2): 5 mg via ORAL
  Filled 2016-06-17 (×2): qty 1

## 2016-06-17 MED ORDER — TOBRAMYCIN 0.3 % OP SOLN
1.0000 [drp] | Freq: Two times a day (BID) | OPHTHALMIC | Status: DC
  Administered 2016-06-17: 1 [drp] via OPHTHALMIC
  Filled 2016-06-17: qty 5

## 2016-06-17 MED ORDER — AZITHROMYCIN 500 MG PO TABS
500.0000 mg | ORAL_TABLET | Freq: Every day | ORAL | Status: DC
  Administered 2016-06-17 (×2): 500 mg via ORAL
  Filled 2016-06-17 (×2): qty 1

## 2016-06-17 MED ORDER — ATORVASTATIN CALCIUM 20 MG PO TABS
40.0000 mg | ORAL_TABLET | Freq: Every day | ORAL | Status: DC
  Filled 2016-06-17 (×2): qty 2

## 2016-06-17 MED ORDER — ENALAPRIL MALEATE 5 MG PO TABS
2.5000 mg | ORAL_TABLET | Freq: Every day | ORAL | Status: DC
  Administered 2016-06-17 – 2016-06-18 (×2): 2.5 mg via ORAL
  Filled 2016-06-17: qty 0.5
  Filled 2016-06-17 (×2): qty 1

## 2016-06-17 MED ORDER — ONDANSETRON HCL 4 MG PO TABS: 4.0000 mg | ORAL_TABLET | Freq: Four times a day (QID) | ORAL | Status: DC | PRN

## 2016-06-17 MED ORDER — HEPARIN SODIUM (PORCINE) 5000 UNIT/ML IJ SOLN
5000.0000 [IU] | Freq: Three times a day (TID) | INTRAMUSCULAR | Status: DC
  Administered 2016-06-17 – 2016-06-18 (×4): 5000 [IU] via SUBCUTANEOUS
  Filled 2016-06-17 (×4): qty 1

## 2016-06-17 MED ORDER — LEVALBUTEROL HCL 1.25 MG/0.5ML IN NEBU: 1.2500 mg | INHALATION_SOLUTION | Freq: Four times a day (QID) | RESPIRATORY_TRACT | Status: DC

## 2016-06-17 MED ORDER — METHYLPREDNISOLONE SODIUM SUCC 125 MG IJ SOLR
60.0000 mg | Freq: Four times a day (QID) | INTRAMUSCULAR | Status: DC
  Administered 2016-06-17 – 2016-06-18 (×6): 60 mg via INTRAVENOUS
  Filled 2016-06-17 (×6): qty 2

## 2016-06-17 MED ORDER — DIAZEPAM 2 MG PO TABS
2.0000 mg | ORAL_TABLET | Freq: Two times a day (BID) | ORAL | Status: DC
  Administered 2016-06-17 – 2016-06-18 (×4): 2 mg via ORAL
  Filled 2016-06-17 (×4): qty 1

## 2016-06-17 MED ORDER — ONDANSETRON HCL 4 MG/2ML IJ SOLN: 4.0000 mg | Freq: Four times a day (QID) | INTRAMUSCULAR | Status: DC | PRN

## 2016-06-17 MED ORDER — LEVALBUTEROL HCL 1.25 MG/0.5ML IN NEBU
1.2500 mg | INHALATION_SOLUTION | Freq: Four times a day (QID) | RESPIRATORY_TRACT | Status: DC
  Administered 2016-06-17 – 2016-06-18 (×4): 1.25 mg via RESPIRATORY_TRACT
  Filled 2016-06-17 (×4): qty 0.5

## 2016-06-17 MED ORDER — PANTOPRAZOLE SODIUM 40 MG PO TBEC
40.0000 mg | DELAYED_RELEASE_TABLET | Freq: Every day | ORAL | Status: DC
  Administered 2016-06-17 – 2016-06-18 (×2): 40 mg via ORAL
  Filled 2016-06-17 (×2): qty 1

## 2016-06-17 MED ORDER — RANOLAZINE ER 500 MG PO TB12
500.0000 mg | ORAL_TABLET | Freq: Two times a day (BID) | ORAL | Status: DC
  Administered 2016-06-17 (×2): 500 mg via ORAL
  Filled 2016-06-17 (×2): qty 1

## 2016-06-17 NOTE — Evaluation (Signed)
Physical Therapy Evaluation Patient Details Name: Carolyn Lewis MRN: [id number] DOB: 1940/09/24 Today's Date: 06/17/2016   History of Present Illness  75 y.o. female who presents with Acute onset shortness of breath requiring increase in her baseline oxygen use. Patient is a history of COPD and states that she had been short of breath with upper respiratory symptoms which started a couple days ago. She had to increase her oxygen from 2 L to 4 L/m (she does increase to 4 typically with activity).    Clinical Impression  Pt is able to do some limited ambulation on 4 liters but is slow, cautious and does have some unsteadiness.  Apparently she has a walker at home that she does not typically use, encouraged her to consider using it more regularly as she has had a few falls this year.  Pt very fatigued with the effort but does not report increased pain with the effort.  Her O2 was 88-90% at rest (HR mid 100s) on 2 liters, increased to mid 90s on 4 liters during ambulation and by the time we got back to the recliner she was in the low 90s with HR in the mid 120s.  Pt had some stagger steps and on LOBs with ambulation but apparently is not far from her baseline.  Pt not interested in HHPT, did encourage her to stay active and make sure she is doing some regular walking.    Follow Up Recommendations Home health PT (had HHPT previous, feels confident she doesn't need now)    Equipment Recommendations   (encouraged pt to use her walker more consistently)    Recommendations for Other Services       Precautions / Restrictions Precautions Precautions: Fall (pt has had ~4 falls this year ) Restrictions Weight Bearing Restrictions: No      Mobility  Bed Mobility Overal bed mobility: Needs Assistance Bed Mobility: Supine to Sit     Supine to sit: Min assist     General bed mobility comments: Pt needs just minimal cuing and light assist with LEs to get to sitting EOB, shows good effort and heavy  reliance on the rails  Transfers Overall transfer level: Modified independent Equipment used: None             General transfer comment: Pt with some initial unsteadiness and held to the O2 tank, but she was able to rise w/o direct assist.  Ambulation/Gait Ambulation/Gait assistance: Min guard;Min assist Ambulation Distance (Feet): 40 Feet Assistive device: None       General Gait Details: Pt shows good effort with ambulation but she does have considerable fatigue with the effort (HR increasese to mid 120's from mid 100s and O2 stays low 90s on 4 liters from 2 at rest).  She is very slow, guarded with the effort but per husband is near her baseline.  Pt had one LOB needing assist to maintain upright.   Stairs            Wheelchair Mobility    Modified Rankin (Stroke Patients Only)       Balance Overall balance assessment: Needs assistance   Sitting balance-Leahy Scale: Good Sitting balance - Comments: Pt able to maintain sitting balance with good confidence/safety.     Standing balance-Leahy Scale: Fair Standing balance comment: Pt with general unsteadiness and lack of confidence with standing acts.  Pertinent Vitals/Pain Pain Assessment:  (not rated - migraine and L side/rib pain from recent fall)    Home Living Family/patient expects to be discharged to:: Private residence Living Arrangements: Spouse/significant other     Home Access: Stairs to enter Entrance Stairs-Rails: None Entrance Stairs-Number of Steps: 3   Home Equipment: Environmental consultantWalker - 4 wheels      Prior Function Level of Independence: Needs assistance   Gait / Transfers Assistance Needed: husband is essentially always with her when she walks  ADL's / Homemaking Assistance Needed: Pt apparently gets assist with dressing, bathing, etc from husband        Hand Dominance        Extremity/Trunk Assessment   Upper Extremity Assessment: Generalized  weakness;Overall WFL for tasks assessed (grossly 4-/5 t/o)           Lower Extremity Assessment: Generalized weakness;Overall WFL for tasks assessed (grossly 4-/5 )         Communication   Communication: No difficulties  Cognition Arousal/Alertness: Awake/alert Behavior During Therapy: Anxious Overall Cognitive Status: Within Functional Limits for tasks assessed                      General Comments      Exercises     Assessment/Plan    PT Assessment Patient needs continued PT services  PT Problem List Decreased strength;Decreased range of motion;Decreased activity tolerance;Decreased balance;Decreased mobility;Decreased coordination;Decreased cognition;Decreased knowledge of use of DME;Decreased safety awareness          PT Treatment Interventions DME instruction;Gait training;Stair training;Functional mobility training;Therapeutic activities;Therapeutic exercise;Balance training;Patient/family education    PT Goals (Current goals can be found in the Care Plan section)  Acute Rehab PT Goals Patient Stated Goal: go home and get a little stronger PT Goal Formulation: With patient/family Time For Goal Achievement: 07/01/16 Potential to Achieve Goals: Fair    Frequency Min 2X/week   Barriers to discharge        Co-evaluation               End of Session Equipment Utilized During Treatment: Gait belt;Oxygen (2 liters at rest, 4 during ambulation) Activity Tolerance: Patient limited by fatigue Patient left: with bed alarm set;with call bell/phone within reach;with family/visitor present           Time: [id number] PT Time Calculation (min) (ACUTE ONLY): 24 min   Charges:   PT Evaluation $PT Eval Low Complexity: 1 Procedure     PT G CodesMalachi Pro:        Parnika Tweten R Aashir Umholtz, DPT 06/17/2016, 5:01 PM

## 2016-06-17 NOTE — Plan of Care (Signed)
Problem: Health Behavior/Discharge Planning: Goal: Ability to manage health-related needs will improve Outcome: Progressing Patient is from home with her husband who cares for her. PT evaluation pending.  Problem: Activity: Goal: Risk for activity intolerance will decrease Outcome: Progressing SOB w/ exertion. Weaned to 2L this morning, oxygen saturations stable on continuous pulse ox at rest.  Desats to 87% on 2L with exertion. BSC provided due to exertion and rest periods encouraged.

## 2016-06-17 NOTE — Progress Notes (Signed)
Initial Nutrition Assessment  DOCUMENTATION CODES:   Not applicable  INTERVENTION:  -Encourage po intake -Recommend Ensure Enlive po BID, each supplement provides 350 kcal and 20 grams of protein -Discussed ways to increased kcals and protein. Pt verbalized understanding.    NUTRITION DIAGNOSIS:   Inadequate oral intake related to acute illness as evidenced by per patient/family report.    GOAL:   Patient will meet greater than or equal to 90% of their needs    MONITOR:   PO intake, Supplement acceptance, Weight trends  REASON FOR ASSESSMENT:   Malnutrition Screening Tool    ASSESSMENT:    75 y.o female admitted with COPD exacerbation, leukocytosis. Pt with history of MI, anxiety, CKD stage III, COPD, GERD, HTN  Pt reports intake over the past week decreased due to not feeling well and increased shortness of breath.  Over the past week eating bites of meals.   Medications reviewed: solumedrol Labs reviewed: Na 132, BUN 25, creatinine 1.49, glucose 146   Nutrition-Focused physical exam completed. Findings are no fat depletion, mild/moderate muscle depletion, and no edema.    Diet Order:  Diet Heart Room service appropriate? Yes; Fluid consistency: Thin  Skin:  Reviewed, no issues  Last BM:  10/22  Height:   Ht Readings from Last 1 Encounters:  06/16/16 5\' 6"  (1.676 m)    Weight: 7% wt loss in the last year and 4 months  Wt Readings from Last 1 Encounters:  06/16/16 134 lb 7.7 oz (61 kg)    Ideal Body Weight:     BMI:  Body mass index is 21.71 kg/m.  Estimated Nutritional Needs:   Kcal:  1525-1830 kcals/d  Protein:  73-92 g/d  Fluid:  >/1.5 L/d  EDUCATION NEEDS:   Education needs addressed  Tamas Suen B. Freida BusmanAllen, RD, LDN [phone] (pager) Weekend/On-Call pager [phone])

## 2016-06-17 NOTE — Care Management (Signed)
Admitted to [facility] with the diagnosis of COPD. Lives with husband, Renae Fickleaul, x 57 years [phone]). Seen Dr. Juel BurrowMasoud last month. [facility] last June (SN, PT, aide). No skilled facility. Decreased appetite x 1 year. Weight 145-132). Larey SeatFell a week ago. Husband held with basic activities of daily living, doesn't drive. Prescriptions are filled at [facility] [place].  Amara Justen S Blanch Stang RN MSN CCM Care Management [phone]

## 2016-06-17 NOTE — Care Management Important Message (Signed)
Important Message  Patient Details  Name: Carolyn Lewis MRN: [id number] Date of Birth: [birth year]   Medicare Important Message Given:  Yes    Gwenette GreetBrenda S Trishna Cwik, RN 06/17/2016, 8:19 AM

## 2016-06-17 NOTE — Progress Notes (Signed)
[employer] [facility] - [place] at [facility]   PATIENT NAME: Carolyn Lewis    MR#:  [id number]  DATE OF BIRTH:  05/02/1941  SUBJECTIVE:  CHIEF COMPLAINT:   Chief Complaint  Patient presents with  . Cough  . COPD   Better shortness of breath and cough. On O2 [place] 3 L. REVIEW OF SYSTEMS:  Review of Systems  Constitutional: Negative for chills and fever.  HENT: Negative for congestion.   Eyes: Negative for blurred vision and double vision.  Respiratory: Positive for cough and shortness of breath. Negative for hemoptysis, sputum production, wheezing and stridor.   Cardiovascular: Negative for chest pain and leg swelling.  Gastrointestinal: Negative for abdominal pain, constipation, diarrhea, nausea and vomiting.  Genitourinary: Negative for dysuria, frequency and urgency.  Musculoskeletal: Negative for joint pain.  Skin: Negative for itching and rash.  Neurological: Negative for dizziness, focal weakness, loss of consciousness and headaches.  Psychiatric/Behavioral: Negative for depression. The patient is not nervous/anxious.     DRUG ALLERGIES:   Allergies  Allergen Reactions  . Sulfa Antibiotics Rash   VITALS:  Blood pressure 133/69, pulse (!) 103, temperature 97.9 F (36.6 C), temperature source Oral, resp. rate 18, height 5\' 6"  (1.676 m), weight 134 lb 7.7 oz (61 kg), SpO2 95 %. PHYSICAL EXAMINATION:  Physical Exam  Constitutional: She is oriented to person, place, and time and well-developed, well-nourished, and in no distress. No distress.  HENT:  Head: Normocephalic.  Mouth/Throat: Oropharynx is clear and moist.  Eyes: Conjunctivae and EOM are normal. No scleral icterus.  Neck: Normal range of motion. Neck supple. No JVD present. No tracheal deviation present.  Cardiovascular: Normal rate, regular rhythm and normal heart sounds.  Exam reveals no gallop.   No murmur heard. Pulmonary/Chest: Effort normal and breath sounds normal. No respiratory distress. She  has no wheezes.  Very diminished breath sounds and limited air entry.  Abdominal: Soft. Bowel sounds are normal. She exhibits no distension. There is no tenderness.  Musculoskeletal: Normal range of motion. She exhibits no edema or tenderness.  Neurological: She is alert and oriented to person, place, and time. No cranial nerve deficit.  Skin: No rash noted. No erythema.  Psychiatric: Affect normal.   LABORATORY PANEL:   CBC  Recent Labs Lab 06/17/16 0603  WBC 16.1*  HGB 10.8*  HCT 32.0*  PLT 359   ------------------------------------------------------------------------------------------------------------------ Chemistries   Recent Labs Lab 06/16/16 1923 06/17/16 0603  NA 129* 132*  K 4.0 4.3  CL 95* 95*  CO2 23 25  GLUCOSE 105* 146*  BUN 26* 25*  CREATININE 1.29* 1.49*  CALCIUM 9.2 9.6  MG  --  2.0  AST 18  --   ALT 11*  --   ALKPHOS 110  --   BILITOT 0.9  --    RADIOLOGY:  Dg Chest Portable 1 View  Result Date: 06/16/2016 CLINICAL DATA:  Productive cough and shortness of breath.  COPD. EXAM: PORTABLE CHEST 1 VIEW COMPARISON:  03/23/2015 FINDINGS: The patient is mildly rotated to the right. Cardiomediastinal silhouette is unchanged within this limitation. The lungs remain hyperinflated without evidence of confluent airspace opacity, edema, pleural effusion, or pneumothorax. Thoracic spondylosis is noted. IMPRESSION: COPD without evidence of acute cardiopulmonary process. Electronically Signed   By: Sebastian Ache M.D.   On: 06/16/2016 19:57   ASSESSMENT AND PLAN:   COPD exacerbation  Continue IV steroids, Xopenex every 6 hours, when necessary duo nebs, azithromycin  Acute on chronic respiratory failure with hypoxia.  Tried to wean down oxygen by nasal cannula, xopenex every 6 hours.  Leukocytosis, normal UA, f/u  blood cultures.No pneumonia seen on chest x-ray.    Anxiety - continue home meds   HTN (hypertension) - continue home meds   GERD (gastroesophageal  reflux disease) - home dose PPI   CKD (chronic kidney disease), stage III - stable, actually improved since last check, avoid nephrotoxins  Hyponatremia, improving. CAD. Continue plavix, lipitor and renexa.  Generalized weakness. PT evaluation. All the records are reviewed and case discussed with Care Management/Social Worker. Management plans discussed with the patient, her husband and they are in agreement.  CODE STATUS: Full code  TOTAL TIME TAKING CARE OF THIS PATIENT: 37 minutes.   More than 50% of the time was spent in counseling/coordination of care: YES  POSSIBLE D/C IN 2 DAYS, DEPENDING ON CLINICAL CONDITION.   Shaune Pollackhen, Arjay Jaskiewicz M.D on 06/17/2016 at 1:06 PM  Between 7am to 6pm - Pager - [phone]  After 6pm go to [email or web address] - Social research officer, governmentpassword EPAS [facility]  [facility] [place] [facility]  Office  [phone]  CC: Primary care physician; Corky DownsMASOUD,JAVED, MD  Note: This dictation was prepared with Dragon dictation along with smaller phrase technology. Any transcriptional errors that result from this process are unintentional.

## 2016-06-17 NOTE — Progress Notes (Signed)
Chaplain was making rounds and visited with pt in room 123. Provided the ministry of pastoral prayer and emotional support to pt.    06/17/16 1310  Clinical Encounter Type  Visited With Patient;Patient and family together  Visit Type Initial;Spiritual support  Referral From Nurse  Spiritual Encounters  Spiritual Needs Prayer;Emotional

## 2016-06-18 LAB — CBC
HCT: 33.9 % — ABNORMAL LOW (ref 35.0–47.0)
HEMOGLOBIN: 11.2 g/dL — AB (ref 12.0–16.0)
MCH: 29 pg (ref 26.0–34.0)
MCHC: 33 g/dL (ref 32.0–36.0)
MCV: 88.2 fL (ref 80.0–100.0)
PLATELETS: 431 10*3/uL (ref 150–440)
RBC: 3.84 MIL/uL (ref 3.80–5.20)
RDW: 15.7 % — AB (ref 11.5–14.5)
WBC: 15.1 10*3/uL — ABNORMAL HIGH (ref 3.6–11.0)

## 2016-06-18 MED ORDER — DEXTROMETHORPHAN HBR 15 MG/5ML PO SYRP: 10.0000 mL | ORAL_SOLUTION | Freq: Four times a day (QID) | ORAL | 0 refills | Status: DC | PRN

## 2016-06-18 MED ORDER — PREDNISONE 10 MG PO TABS: ORAL_TABLET | ORAL | 0 refills | Status: DC

## 2016-06-18 MED ORDER — AZITHROMYCIN 500 MG PO TABS: 500.0000 mg | ORAL_TABLET | Freq: Every day | ORAL | 0 refills | Status: DC

## 2016-06-18 NOTE — Discharge Instructions (Signed)
Heart healthy diet. °Activity as tolerated. °Continue home O2 [place] 2L. °

## 2016-06-18 NOTE — Discharge Summary (Signed)
[facility] - [place] at [facility]   PATIENT NAME: Carolyn Lewis    MR#:  [id number]  DATE OF BIRTH:  1940-11-12  DATE OF ADMISSION:  06/16/2016   ADMITTING PHYSICIAN: Oralia Manis, MD  DATE OF DISCHARGE: 06/18/2016 11:01 AM  PRIMARY CARE PHYSICIAN: MASOUD,JAVED, MD   ADMISSION DIAGNOSIS:  COPD exacerbation (HCC) [J44.1] DISCHARGE DIAGNOSIS:  Principal Problem:   COPD exacerbation (HCC) Active Problems:   Anxiety   GERD (gastroesophageal reflux disease)   HTN (hypertension)   CKD (chronic kidney disease), stage III  SECONDARY DIAGNOSIS:   Past Medical History:  Diagnosis Date  . Acute MI   . Anxiety   . CKD stage G3b/A3, GFR 30 - 44 and albumin creatinine ratio >300 mg/g   . COPD (chronic obstructive pulmonary disease) (HCC)   . GERD (gastroesophageal reflux disease)   . Hypertension    HOSPITAL COURSE:   COPD exacerbation, improved. Treated with IV steroids, Xopenex every 6 hours, when necessary duo nebs, azithromycin. Change to po prednisone taper.  Acute on chronic respiratory failure with hypoxia. Wean down oxygen to 2 L by nasal cannula.  Leukocytosis, normal UA, f/u  blood cultures.No pneumonia seen on chest x-ray.  Anxiety - continue home meds HTN (hypertension) - continue home meds GERD (gastroesophageal reflux disease) - home dose PPI CKD (chronic kidney disease), stage III - stable, actually improved since last check, avoid nephrotoxins  Hyponatremia, improving. CAD. Continue plavix, lipitor and renexa.  DISCHARGE CONDITIONS:  Stable, discharged to home today. CONSULTS OBTAINED:   DRUG ALLERGIES:   Allergies  Allergen Reactions  . Sulfa Antibiotics Rash   DISCHARGE MEDICATIONS:     Medication List    TAKE these medications   amitriptyline 25 MG tablet Commonly known as:  ELAVIL Take 25 mg by mouth at bedtime.   amLODipine 5 MG tablet Commonly known as:  NORVASC Take 5 mg by mouth daily.     atorvastatin 40 MG tablet Commonly known as:  LIPITOR Take 40 mg by mouth daily.   azithromycin 500 MG tablet Commonly known as:  ZITHROMAX Take 1 tablet (500 mg total) by mouth at bedtime.   clopidogrel 75 MG tablet Commonly known as:  PLAVIX Take 75 mg by mouth daily.   COMBIVENT RESPIMAT 20-100 MCG/ACT Aers respimat Generic drug:  Ipratropium-Albuterol Inhale 1 puff into the lungs 3 (three) times daily as needed for wheezing or shortness of breath.   dextromethorphan 15 MG/5ML syrup Take 10 mLs (30 mg total) by mouth 4 (four) times daily as needed for cough.   diazepam 2 MG tablet Commonly known as:  VALIUM Take 2 mg by mouth 2 (two) times daily.   enalapril 2.5 MG tablet Commonly known as:  VASOTEC Take 2.5 mg by mouth daily.   famotidine 20 MG tablet Commonly known as:  PEPCID Take 20 mg by mouth daily.   furosemide 20 MG tablet Commonly known as:  LASIX Take 20 mg by mouth daily. Patient only takes Monday, Wednesday, and Friday.   levalbuterol 1.25 MG/3ML nebulizer solution Commonly known as:  XOPENEX Take 1.25 mg by nebulization every 4 (four) hours as needed for wheezing or shortness of breath.   metoprolol 50 MG tablet Commonly known as:  LOPRESSOR Take 1 tablet (50 mg total) by mouth 2 (two) times daily.   nitroGLYCERIN 0.4 MG SL tablet Commonly known as:  NITROSTAT Place 0.4 mg under the tongue every 5 (five) minutes as needed for chest pain.   pantoprazole 40 MG  tablet Commonly known as:  PROTONIX Take 40 mg by mouth daily.   predniSONE 10 MG tablet Commonly known as:  DELTASONE 40 mg po daily for 2 days, 20 mg po daily for 2 days, 10 mg po daily for 2 days.   traMADol 50 MG tablet Commonly known as:  ULTRAM Take 50 mg by mouth 2 (two) times daily.        DISCHARGE INSTRUCTIONS:  See AVS OXYGEN:  Home Oxygen: 2 L [place]   If you experience worsening of your admission symptoms, develop shortness of breath, life threatening emergency,  suicidal or homicidal thoughts you must seek medical attention immediately by calling 911 or calling your MD immediately  if symptoms less severe.  You Must read complete instructions/literature along with all the possible adverse reactions/side effects for all the Medicines you take and that have been prescribed to you. Take any new Medicines after you have completely understood and accpet all the possible adverse reactions/side effects.   Please note  You were cared for by a hospitalist during your hospital stay. If you have any questions about your discharge medications or the care you received while you were in the hospital after you are discharged, you can call the unit and asked to speak with the hospitalist on call if the hospitalist that took care of you is not available. Once you are discharged, your primary care physician will handle any further medical issues. Please note that NO REFILLS for any discharge medications will be authorized once you are discharged, as it is imperative that you return to your primary care physician (or establish a relationship with a primary care physician if you do not have one) for your aftercare needs so that they can reassess your need for medications and monitor your lab values.    On the day of Discharge:  VITAL SIGNS:  Blood pressure 113/70, pulse (!) 105, temperature 97.6 F (36.4 C), temperature source Oral, resp. rate (!) 23, height 5\' 6"  (1.676 m), weight 134 lb 7.7 oz (61 kg), SpO2 93 %. PHYSICAL EXAMINATION:  GENERAL:  75 y.o.-year-old patient lying in the bed with no acute distress.  EYES: Pupils equal, round, reactive to light and accommodation. No scleral icterus. Extraocular muscles intact.  HEENT: Head atraumatic, normocephalic. Oropharynx and nasopharynx clear.  NECK:  Supple, no jugular venous distention. No thyroid enlargement, no tenderness.  LUNGS: Normal breath sounds bilaterally, no wheezing, rales,rhonchi or crepitation. No use of  accessory muscles of respiration.  CARDIOVASCULAR: S1, S2 normal. No murmurs, rubs, or gallops.  ABDOMEN: Soft, non-tender, non-distended. Bowel sounds present. No organomegaly or mass.  EXTREMITIES: No pedal edema, cyanosis, or clubbing.  NEUROLOGIC: Cranial nerves II through XII are intact. Muscle strength 5/5 in all extremities. Sensation intact. Gait not checked.  PSYCHIATRIC: The patient is alert and oriented x 3.  SKIN: No obvious rash, lesion, or ulcer.  DATA REVIEW:   CBC  Recent Labs Lab 06/18/16 0436  WBC 15.1*  HGB 11.2*  HCT 33.9*  PLT 431    Chemistries   Recent Labs Lab 06/16/16 1923 06/17/16 0603  NA 129* 132*  K 4.0 4.3  CL 95* 95*  CO2 23 25  GLUCOSE 105* 146*  BUN 26* 25*  CREATININE 1.29* 1.49*  CALCIUM 9.2 9.6  MG  --  2.0  AST 18  --   ALT 11*  --   ALKPHOS 110  --   BILITOT 0.9  --      Microbiology Results  Results for orders placed or performed during the hospital encounter of 06/16/16  Blood culture (routine x 2)     Status: None (Preliminary result)   Collection Time: 06/16/16  7:23 PM  Result Value Ref Range Status   Specimen Description BLOOD RIGHT ARM  Final   Special Requests BOTTLES DRAWN AEROBIC AND ANAEROBIC 10CC  Final   Culture NO GROWTH 2 DAYS  Final   Report Status PENDING  Incomplete  Blood culture (routine x 2)     Status: None (Preliminary result)   Collection Time: 06/16/16  8:58 PM  Result Value Ref Range Status   Specimen Description BLOOD LT AC  Final   Special Requests BOTTLES DRAWN AEROBIC AND ANAEROBIC 10CC  Final   Culture NO GROWTH 2 DAYS  Final   Report Status PENDING  Incomplete    RADIOLOGY:  No results found.   Management plans discussed with the patient, her husband and they are in agreement.  CODE STATUS:  Code Status History    Date Active Date Inactive Code Status Order ID Comments User Context   06/17/2016 12:11 AM 06/18/2016  2:26 PM Full Code [id number]  Oralia Manis, MD Inpatient    02/06/2015  3:10 PM 02/07/2015  4:31 PM Full Code [id number]  Adrian Saran, MD Inpatient   01/31/2015  2:35 PM 02/03/2015  4:04 PM Full Code [id number]  Adrian Saran, MD ED      TOTAL TIME TAKING CARE OF THIS PATIENT: 37 minutes.    Shaune Pollack M.D on 06/18/2016 at 4:10 PM  Between 7am to 6pm - Pager - [phone]  After 6pm go to [email or web address] - Social research officer, government  [facility] [place] [facility]  Office  [phone]  CC: Primary care physician; Corky Downs, MD   Note: This dictation was prepared with Dragon dictation along with smaller phrase technology. Any transcriptional errors that result from this process are unintentional.

## 2016-06-18 NOTE — Care Management (Signed)
Discharge to home today per Dr. Imogene Burnhen. Declines home health services at this time. Husband will take care of all Carolyn Lewis's needs. Husband will transport. Gwenette GreetBrenda S Charletta Voight RN MSN CCM Care management [phone]

## 2016-06-21 LAB — CULTURE, BLOOD (ROUTINE X 2)
CULTURE: NO GROWTH
Culture: NO GROWTH

## 2016-07-07 DIAGNOSIS — J440 Chronic obstructive pulmonary disease with acute lower respiratory infection: Secondary | ICD-10-CM | POA: Diagnosis not present

## 2016-07-07 DIAGNOSIS — I517 Cardiomegaly: Secondary | ICD-10-CM | POA: Diagnosis not present

## 2016-07-07 DIAGNOSIS — J209 Acute bronchitis, unspecified: Secondary | ICD-10-CM | POA: Diagnosis not present

## 2016-07-07 DIAGNOSIS — K222 Esophageal obstruction: Secondary | ICD-10-CM | POA: Diagnosis not present

## 2016-07-07 DIAGNOSIS — I209 Angina pectoris, unspecified: Secondary | ICD-10-CM | POA: Diagnosis not present

## 2016-07-14 DIAGNOSIS — J449 Chronic obstructive pulmonary disease, unspecified: Secondary | ICD-10-CM | POA: Diagnosis not present

## 2016-08-13 DIAGNOSIS — J449 Chronic obstructive pulmonary disease, unspecified: Secondary | ICD-10-CM | POA: Diagnosis not present

## 2016-09-13 DIAGNOSIS — J449 Chronic obstructive pulmonary disease, unspecified: Secondary | ICD-10-CM | POA: Diagnosis not present

## 2016-10-02 DIAGNOSIS — I714 Abdominal aortic aneurysm, without rupture: Secondary | ICD-10-CM | POA: Diagnosis not present

## 2016-10-02 DIAGNOSIS — K222 Esophageal obstruction: Secondary | ICD-10-CM | POA: Diagnosis not present

## 2016-10-02 DIAGNOSIS — R296 Repeated falls: Secondary | ICD-10-CM | POA: Diagnosis not present

## 2016-10-02 DIAGNOSIS — R627 Adult failure to thrive: Secondary | ICD-10-CM | POA: Diagnosis not present

## 2016-10-14 DIAGNOSIS — J449 Chronic obstructive pulmonary disease, unspecified: Secondary | ICD-10-CM | POA: Diagnosis not present

## 2016-10-30 ENCOUNTER — Encounter: Payer: Self-pay | Admitting: Gastroenterology

## 2016-10-30 ENCOUNTER — Ambulatory Visit (INDEPENDENT_AMBULATORY_CARE_PROVIDER_SITE_OTHER): Payer: Medicare Other | Admitting: Gastroenterology

## 2016-10-30 VITALS — BP 137/76 | HR 97 | Ht 65.0 in | Wt 132.0 lb

## 2016-10-30 DIAGNOSIS — R1310 Dysphagia, unspecified: Secondary | ICD-10-CM | POA: Diagnosis not present

## 2016-10-30 DIAGNOSIS — R1319 Other dysphagia: Secondary | ICD-10-CM

## 2016-10-30 NOTE — Progress Notes (Signed)
Gastroenterology Consultation  Referring Provider:     Corky DownsMasoud, Javed, MD Primary Care Physician:  Corky DownsMASOUD,JAVED, MD Primary Gastroenterologist:  Dr. Servando SnareWohl     Reason for Consultation:     Dysphagia        HPI:   Carolyn Lewis is a 76 y.o. y/o female referred for consultation & management of Dysphagia by Dr. Corky DownsMASOUD,JAVED, MD.  This patient comes in today with a history of dysphagia. The patient has had esophagus dilated the past. The patient states that she will get solids stuck and then both liquids and solids will not go down until it passes. The patient also states that she has problems with pills when she has the dysphagia. The patient fourth that her last EGD was a couple of years ago.  Past Medical History:  Diagnosis Date  . AAA (abdominal aortic aneurysm) (HCC)   . Acute MI   . Anxiety   . CKD stage G3b/A3, GFR 30 - 44 and albumin creatinine ratio >300 mg/g   . COPD (chronic obstructive pulmonary disease) (HCC)   . GERD (gastroesophageal reflux disease)   . Hypertension     Past Surgical History:  Procedure Laterality Date  . ABDOMINAL HYSTERECTOMY    . CARDIAC CATHETERIZATION  2011    Prior to Admission medications   Medication Sig Start Date End Date Taking? Authorizing Provider  amitriptyline (ELAVIL) 25 MG tablet Take 25 mg by mouth at bedtime.   Yes Historical Provider, MD  amLODipine (NORVASC) 5 MG tablet Take 5 mg by mouth daily.   Yes Historical Provider, MD  atorvastatin (LIPITOR) 40 MG tablet Take 40 mg by mouth daily.   Yes Historical Provider, MD  clopidogrel (PLAVIX) 75 MG tablet Take 75 mg by mouth daily.   Yes Historical Provider, MD  diazepam (VALIUM) 2 MG tablet Take 2 mg by mouth 2 (two) times daily.   Yes Historical Provider, MD  enalapril (VASOTEC) 2.5 MG tablet Take 2.5 mg by mouth daily.   Yes Historical Provider, MD  famotidine (PEPCID) 20 MG tablet Take 20 mg by mouth daily.   Yes Historical Provider, MD  furosemide (LASIX) 20 MG tablet Take 20 mg  by mouth daily. Patient only takes Monday, Wednesday, and Friday.    Yes Historical Provider, MD  Ipratropium-Albuterol (COMBIVENT RESPIMAT) 20-100 MCG/ACT AERS respimat Inhale 1 puff into the lungs 3 (three) times daily as needed for wheezing or shortness of breath.   Yes Historical Provider, MD  levalbuterol (XOPENEX) 1.25 MG/3ML nebulizer solution Take 1.25 mg by nebulization every 4 (four) hours as needed for wheezing or shortness of breath.   Yes Historical Provider, MD  metoprolol (LOPRESSOR) 50 MG tablet Take 1 tablet (50 mg total) by mouth 2 (two) times daily. 02/03/15  Yes Adrian SaranSital Mody, MD  nitroGLYCERIN (NITROSTAT) 0.4 MG SL tablet Place 0.4 mg under the tongue every 5 (five) minutes as needed for chest pain.   Yes Historical Provider, MD  pantoprazole (PROTONIX) 40 MG tablet Take 40 mg by mouth daily.   Yes Historical Provider, MD  traMADol (ULTRAM) 50 MG tablet Take 50 mg by mouth 2 (two) times daily.   Yes Historical Provider, MD    No family history on file.   Social History  Substance Use Topics  . Smoking status: Former Smoker    Packs/day: 1.00    Years: 15.00    Types: Cigarettes    Quit date: 2015  . Smokeless tobacco: Never Used  . Alcohol use No  Allergies as of 10/30/2016 - Review Complete 10/30/2016  Allergen Reaction Noted  . Sulfa antibiotics Rash 01/31/2015    Review of Systems:    All systems reviewed and negative except where noted in HPI.   Physical Exam:  BP 137/76   Pulse 97   Ht 5\' 5"  (1.651 m)   Wt 132 lb (59.9 kg)   BMI 21.97 kg/m  No LMP recorded. Patient has had a hysterectomy. Psych:  Alert and cooperative. Normal mood and affect. General:   Alert,  Well-developed, well-nourished, pleasant and cooperative in NAD Head:  Normocephalic and atraumatic. Eyes:  Sclera clear, no icterus.   Conjunctiva pink. Ears:  Normal auditory acuity. Nose:  No deformity, discharge, or lesions. Mouth:  No deformity or lesions,oropharynx pink & moist. Neck:   Supple; no masses or thyromegaly. Lungs:  Patient on oxygen with tachypnea and diffuse rhonchi.  Heart:  Regular rate and rhythm; no murmurs, clicks, rubs, or gallops. Abdomen:  Normal bowel sounds.  No bruits.  Soft, non-tender and non-distended without masses, hepatosplenomegaly or hernias noted.  No guarding or rebound tenderness.  Negative Carnett sign.   Rectal:  Deferred.  Msk:  Symmetrical without gross deformities.  Good, equal movement & strength bilaterally. Pulses:  Normal pulses noted. Extremities:  No clubbing or edema.  No cyanosis. Neurologic:  Alert and oriented x3;  grossly normal neurologically. Skin:  Intact without significant lesions or rashes.  No jaundice. Lymph Nodes:  No significant cervical adenopathy. Psych:  Alert and cooperative. Normal mood and affect.  Imaging Studies: No results found.  Assessment and Plan:   Carolyn Lewis is a 76 y.o. y/o female who comes in today with a history of dysphagia in the past and has had stretching of her esophagus. The patient will be set up for an EGD by me. The patient was a previous smoker but is no longer a smoker but is on chronic oxygen.I have discussed risks & benefits which include, but are not limited to, bleeding, infection, perforation & drug reaction.  The patient agrees with this plan & written consent will be obtained.       [name], MD. [place]   Note: This dictation was prepared with Dragon dictation along with smaller phrase technology. Any transcriptional errors that result from this process are unintentional.

## 2016-11-06 ENCOUNTER — Other Ambulatory Visit: Payer: Self-pay

## 2016-11-06 DIAGNOSIS — R1310 Dysphagia, unspecified: Secondary | ICD-10-CM

## 2016-11-06 DIAGNOSIS — K222 Esophageal obstruction: Secondary | ICD-10-CM | POA: Diagnosis not present

## 2016-11-06 DIAGNOSIS — I714 Abdominal aortic aneurysm, without rupture: Secondary | ICD-10-CM | POA: Diagnosis not present

## 2016-11-06 DIAGNOSIS — R627 Adult failure to thrive: Secondary | ICD-10-CM | POA: Diagnosis not present

## 2016-11-06 DIAGNOSIS — R296 Repeated falls: Secondary | ICD-10-CM | POA: Diagnosis not present

## 2016-11-11 DIAGNOSIS — J449 Chronic obstructive pulmonary disease, unspecified: Secondary | ICD-10-CM | POA: Diagnosis not present

## 2016-11-12 ENCOUNTER — Telehealth: Payer: Self-pay

## 2016-11-12 NOTE — Telephone Encounter (Signed)
-----   Message from Esmeralda I Monegro sent at 11/11/2016  2:01 PM EDT ----- No prior auth per Josh S @[employer]  No ded  Max oop 4400 rem 4350 Pick up 100% after $275 copay ----- Message ----- From: Ivar Domangue, CMA Sent: 11/06/2016   9:11 AM To: Esmeralda I Monegro  EGD scheduled at [facility] with Wohl on 11/20/16. Please precert for dysphagia R13.10.  

## 2016-11-12 NOTE — Telephone Encounter (Signed)
-----   Message from PeckEsmeralda I Monegro sent at 11/11/2016  2:01 PM EDT ----- No prior auth per Sharia ReeveJosh S @[employer]   No ded  Max oop 4400 rem 4350 Pick up 100% after $275 copay ----- Message ----- From: Rayann HemanGinger Lener Ventresca, CMA Sent: 11/06/2016   9:11 AM To: Marylynn PearsonEsmeralda I Monegro  [facility] 11/20/16. Please precert for dysphagia R13.10.

## 2016-11-13 DIAGNOSIS — I714 Abdominal aortic aneurysm, without rupture: Secondary | ICD-10-CM | POA: Diagnosis not present

## 2016-11-13 DIAGNOSIS — R296 Repeated falls: Secondary | ICD-10-CM | POA: Diagnosis not present

## 2016-11-13 DIAGNOSIS — K222 Esophageal obstruction: Secondary | ICD-10-CM | POA: Diagnosis not present

## 2016-11-13 DIAGNOSIS — R627 Adult failure to thrive: Secondary | ICD-10-CM | POA: Diagnosis not present

## 2016-11-17 ENCOUNTER — Encounter: Payer: Self-pay | Admitting: *Deleted

## 2016-11-20 ENCOUNTER — Ambulatory Visit: Payer: Medicare Other | Admitting: Anesthesiology

## 2016-11-20 ENCOUNTER — Encounter
Admission: RE | Disposition: A | Discharge status: Home / Self Care | Payer: Self-pay | Source: Ambulatory Visit | Attending: Gastroenterology

## 2016-11-20 ENCOUNTER — Ambulatory Visit
Admission: RE | Admit: 2016-11-20 | Discharge: 2016-11-20 | Disposition: A | Discharge status: Home / Self Care | Payer: Medicare Other | Source: Ambulatory Visit | Attending: Gastroenterology | Admitting: Gastroenterology

## 2016-11-20 DIAGNOSIS — K219 Gastro-esophageal reflux disease without esophagitis: Secondary | ICD-10-CM | POA: Insufficient documentation

## 2016-11-20 DIAGNOSIS — Z955 Presence of coronary angioplasty implant and graft: Secondary | ICD-10-CM | POA: Insufficient documentation

## 2016-11-20 DIAGNOSIS — K2970 Gastritis, unspecified, without bleeding: Secondary | ICD-10-CM | POA: Diagnosis not present

## 2016-11-20 DIAGNOSIS — I739 Peripheral vascular disease, unspecified: Secondary | ICD-10-CM | POA: Diagnosis not present

## 2016-11-20 DIAGNOSIS — Z9981 Dependence on supplemental oxygen: Secondary | ICD-10-CM | POA: Insufficient documentation

## 2016-11-20 DIAGNOSIS — J449 Chronic obstructive pulmonary disease, unspecified: Secondary | ICD-10-CM | POA: Diagnosis not present

## 2016-11-20 DIAGNOSIS — F419 Anxiety disorder, unspecified: Secondary | ICD-10-CM | POA: Diagnosis not present

## 2016-11-20 DIAGNOSIS — Z87891 Personal history of nicotine dependence: Secondary | ICD-10-CM | POA: Diagnosis not present

## 2016-11-20 DIAGNOSIS — R1310 Dysphagia, unspecified: Secondary | ICD-10-CM | POA: Diagnosis not present

## 2016-11-20 DIAGNOSIS — I129 Hypertensive chronic kidney disease with stage 1 through stage 4 chronic kidney disease, or unspecified chronic kidney disease: Secondary | ICD-10-CM | POA: Insufficient documentation

## 2016-11-20 DIAGNOSIS — K2930 Chronic superficial gastritis without bleeding: Secondary | ICD-10-CM | POA: Diagnosis not present

## 2016-11-20 DIAGNOSIS — N183 Chronic kidney disease, stage 3 (moderate): Secondary | ICD-10-CM | POA: Diagnosis not present

## 2016-11-20 DIAGNOSIS — I252 Old myocardial infarction: Secondary | ICD-10-CM | POA: Insufficient documentation

## 2016-11-20 DIAGNOSIS — Z79899 Other long term (current) drug therapy: Secondary | ICD-10-CM | POA: Diagnosis not present

## 2016-11-20 DIAGNOSIS — Z7902 Long term (current) use of antithrombotics/antiplatelets: Secondary | ICD-10-CM | POA: Insufficient documentation

## 2016-11-20 DIAGNOSIS — K222 Esophageal obstruction: Secondary | ICD-10-CM | POA: Diagnosis not present

## 2016-11-20 DIAGNOSIS — K2950 Unspecified chronic gastritis without bleeding: Secondary | ICD-10-CM | POA: Insufficient documentation

## 2016-11-20 HISTORY — DX: Headache, unspecified: R51.9

## 2016-11-20 HISTORY — PX: ESOPHAGOGASTRODUODENOSCOPY (EGD) WITH PROPOFOL: SHX5813

## 2016-11-20 HISTORY — DX: Presence of other vascular implants and grafts: Z95.828

## 2016-11-20 HISTORY — PX: ESOPHAGEAL DILATION: SHX303

## 2016-11-20 HISTORY — DX: Dyspnea, unspecified: R06.00

## 2016-11-20 HISTORY — DX: Presence of dental prosthetic device (complete) (partial): Z97.2

## 2016-11-20 HISTORY — DX: Headache: R51

## 2016-11-20 SURGERY — ESOPHAGOGASTRODUODENOSCOPY (EGD) WITH PROPOFOL
Anesthesia: Monitor Anesthesia Care | Wound class: Clean Contaminated

## 2016-11-20 MED ORDER — LIDOCAINE HCL (CARDIAC) 20 MG/ML IV SOLN
INTRAVENOUS | Status: DC | PRN
  Administered 2016-11-20: 40 mg via INTRAVENOUS

## 2016-11-20 MED ORDER — LACTATED RINGERS IV SOLN
INTRAVENOUS | Status: DC | PRN
  Administered 2016-11-20: 11:00:00 via INTRAVENOUS

## 2016-11-20 MED ORDER — GLYCOPYRROLATE 0.2 MG/ML IJ SOLN
INTRAMUSCULAR | Status: DC | PRN
  Administered 2016-11-20: 0.2 mg via INTRAVENOUS

## 2016-11-20 MED ORDER — PROPOFOL 10 MG/ML IV BOLUS
INTRAVENOUS | Status: DC | PRN
  Administered 2016-11-20: 20 mg via INTRAVENOUS
  Administered 2016-11-20: 50 mg via INTRAVENOUS
  Administered 2016-11-20: 30 mg via INTRAVENOUS

## 2016-11-20 SURGICAL SUPPLY — 32 items
BALLN DILATOR 10-12 8 (BALLOONS)
BALLN DILATOR 12-15 8 (BALLOONS)
BALLN DILATOR 15-18 8 (BALLOONS) ×4
BALLN DILATOR CRE 0-12 8 (BALLOONS)
BALLN DILATOR ESOPH 8 10 CRE (MISCELLANEOUS) IMPLANT
BALLOON DILATOR 12-15 8 (BALLOONS) IMPLANT
BALLOON DILATOR 15-18 8 (BALLOONS) ×2 IMPLANT
BALLOON DILATOR CRE 0-12 8 (BALLOONS) IMPLANT
BLOCK BITE 60FR ADLT L/F GRN (MISCELLANEOUS) ×4 IMPLANT
CANISTER SUCT 1200ML W/VALVE (MISCELLANEOUS) ×4 IMPLANT
CLIP HMST 235XBRD CATH ROT (MISCELLANEOUS) IMPLANT
CLIP RESOLUTION 360 11X235 (MISCELLANEOUS)
FCP ESCP3.2XJMB 240X2.8X (MISCELLANEOUS)
FORCEPS BIOP RAD 4 LRG CAP 4 (CUTTING FORCEPS) IMPLANT
FORCEPS BIOP RJ4 240 W/NDL (MISCELLANEOUS)
FORCEPS ESCP3.2XJMB 240X2.8X (MISCELLANEOUS) IMPLANT
GOWN CVR UNV OPN BCK APRN NK (MISCELLANEOUS) ×4 IMPLANT
GOWN ISOL THUMB LOOP REG UNIV (MISCELLANEOUS) ×4
INJECTOR VARIJECT VIN23 (MISCELLANEOUS) IMPLANT
KIT DEFENDO VALVE AND CONN (KITS) IMPLANT
KIT ENDO PROCEDURE OLY (KITS) ×4 IMPLANT
MARKER SPOT ENDO TATTOO 5ML (MISCELLANEOUS) IMPLANT
PAD GROUND ADULT SPLIT (MISCELLANEOUS) IMPLANT
RETRIEVER NET PLAT FOOD (MISCELLANEOUS) IMPLANT
SNARE SHORT THROW 13M SML OVAL (MISCELLANEOUS) IMPLANT
SNARE SHORT THROW 30M LRG OVAL (MISCELLANEOUS) IMPLANT
SPOT EX ENDOSCOPIC TATTOO (MISCELLANEOUS)
SYR INFLATION 60ML (SYRINGE) ×4 IMPLANT
TRAP ETRAP POLY (MISCELLANEOUS) IMPLANT
VARIJECT INJECTOR VIN23 (MISCELLANEOUS)
WATER STERILE IRR 250ML POUR (IV SOLUTION) ×4 IMPLANT
WIRE CRE 18-20MM 8CM F G (MISCELLANEOUS) IMPLANT

## 2016-11-20 NOTE — H&P (Signed)
Midge Miniumarren Nataliyah Packham, MD [facility] [address] [place], [place] [address] Phone:[phone] Fax : [phone]  Primary Care Physician:  Corky DownsMASOUD,JAVED, MD Primary Gastroenterologist:  Dr. Servando SnareWohl  Pre-Procedure History & Physical: HPI:  Carolyn Lewis is a 76 y.o. female is here for an endoscopy.   Past Medical History:  Diagnosis Date  . AAA (abdominal aortic aneurysm) (HCC)   . Acute MI   . Anxiety   . CKD stage G3b/A3, GFR 30 - 44 and albumin creatinine ratio >300 mg/g   . COPD (chronic obstructive pulmonary disease) (HCC)   . Dyspnea   . GERD (gastroesophageal reflux disease)   . Headache    migraines  . Hypertension   . Presence of stent in artery 01/2010   2 cardiac stents  . Wears dentures    full upper and lower    Past Surgical History:  Procedure Laterality Date  . ABDOMINAL HYSTERECTOMY    . CARDIAC CATHETERIZATION  2011    Prior to Admission medications   Medication Sig Start Date End Date Taking? Authorizing Provider  amitriptyline (ELAVIL) 25 MG tablet Take 25 mg by mouth at bedtime.   Yes Historical Provider, MD  amLODipine (NORVASC) 5 MG tablet Take 5 mg by mouth daily.   Yes Historical Provider, MD  atorvastatin (LIPITOR) 40 MG tablet Take 40 mg by mouth daily.   Yes Historical Provider, MD  clopidogrel (PLAVIX) 75 MG tablet Take 75 mg by mouth daily.   Yes Historical Provider, MD  diazepam (VALIUM) 2 MG tablet Take 2 mg by mouth 2 (two) times daily.   Yes Historical Provider, MD  enalapril (VASOTEC) 2.5 MG tablet Take 2.5 mg by mouth daily.   Yes Historical Provider, MD  famotidine (PEPCID) 20 MG tablet Take 20 mg by mouth daily.   Yes Historical Provider, MD  furosemide (LASIX) 20 MG tablet Take 20 mg by mouth daily. Patient only takes Monday, Wednesday, and Friday.    Yes Historical Provider, MD  Ipratropium-Albuterol (COMBIVENT RESPIMAT) 20-100 MCG/ACT AERS respimat Inhale 1 puff into the lungs 3 (three) times daily as needed for wheezing or shortness of  breath.   Yes Historical Provider, MD  levalbuterol (XOPENEX) 1.25 MG/3ML nebulizer solution Take 1.25 mg by nebulization every 4 (four) hours as needed for wheezing or shortness of breath.   Yes Historical Provider, MD  metoprolol (LOPRESSOR) 50 MG tablet Take 1 tablet (50 mg total) by mouth 2 (two) times daily. 02/03/15  Yes Adrian SaranSital Mody, MD  nitroGLYCERIN (NITROSTAT) 0.4 MG SL tablet Place 0.4 mg under the tongue every 5 (five) minutes as needed for chest pain.   Yes Historical Provider, MD  pantoprazole (PROTONIX) 40 MG tablet Take 40 mg by mouth daily.   Yes Historical Provider, MD  traMADol (ULTRAM) 50 MG tablet Take 50 mg by mouth 2 (two) times daily.   Yes Historical Provider, MD    Allergies as of 11/06/2016 - Review Complete 10/30/2016  Allergen Reaction Noted  . Sulfa antibiotics Rash 01/31/2015    History reviewed. No pertinent family history.  Social History   Social History  . Marital status: Married    Spouse name: N/A  . Number of children: N/A  . Years of education: N/A   Occupational History  . Not on file.   Social History Main Topics  . Smoking status: Former Smoker    Packs/day: 1.00    Years: 15.00    Types: Cigarettes    Quit date: 2015  . Smokeless tobacco:  Never Used  . Alcohol use No  . Drug use: No  . Sexual activity: Not Currently    Birth control/ protection: Post-menopausal   Other Topics Concern  . Not on file   Social History Narrative  . No narrative on file    Review of Systems: See HPI, otherwise negative ROS  Physical Exam: BP 117/64   Pulse 86   Temp 98.1 F (36.7 C) (Temporal)   Ht 5\' 5"  (1.651 m)   Wt 128 lb (58.1 kg)   SpO2 98%   BMI 21.30 kg/m  General:   Alert,  pleasant and cooperative in NAD Head:  Normocephalic and atraumatic. Neck:  Supple; no masses or thyromegaly. Lungs:  Clear throughout to auscultation.    Heart:  Regular rate and rhythm. Abdomen:  Soft, nontender and nondistended. Normal bowel sounds,  without guarding, and without rebound.   Neurologic:  Alert and  oriented x4;  grossly normal neurologically.  Impression/Plan: Carolyn Pons is here for an endoscopy to be performed for dysphagia  Risks, benefits, limitations, and alternatives regarding  endoscopy have been reviewed with the patient.  Questions have been answered.  All parties agreeable.   [name], MD  11/20/2016, 9:44 AM

## 2016-11-20 NOTE — Anesthesia Postprocedure Evaluation (Signed)
Anesthesia Post Note  Patient: Carolyn Lewis  Procedure(s) Performed: Procedure(s) (LRB): ESOPHAGOGASTRODUODENOSCOPY (EGD) WITH PROPOFOL (N/A) ESOPHAGEAL DILATION  Patient location during evaluation: PACU Anesthesia Type: MAC Level of consciousness: awake and alert and oriented Pain management: satisfactory to patient Vital Signs Assessment: post-procedure vital signs reviewed and stable Respiratory status: spontaneous breathing, nonlabored ventilation and respiratory function stable Cardiovascular status: blood pressure returned to baseline and stable Postop Assessment: Adequate PO intake and No signs of nausea or vomiting Anesthetic complications: no    Raliegh Ip

## 2016-11-20 NOTE — Op Note (Signed)
[facility] Gastroenterology Patient Name: Carolyn PennerMary Kraker Procedure Date: 11/20/2016 10:37 AM MRN: [id number] Account #: [id number] Date of Birth: Jun 14, 1941 Admit Type: Outpatient Age: [age] [facility] Gender: Female Note Status: Finalized Procedure:            Upper GI endoscopy Indications:          Dysphagia Providers:            Midge Miniumarren Kamilia Carollo MD, MD Referring MD:         Corky DownsJaved Masoud, MD (Referring MD) Medicines:            Propofol per Anesthesia Complications:        No immediate complications. Procedure:            Pre-Anesthesia Assessment:                       - Prior to the procedure, a History and Physical was                        performed, and patient medications and allergies were                        reviewed. The patient's tolerance of previous                        anesthesia was also reviewed. The risks and benefits of                        the procedure and the sedation options and risks were                        discussed with the patient. All questions were                        answered, and informed consent was obtained. Prior                        Anticoagulants: The patient has taken no previous                        anticoagulant or antiplatelet agents. ASA Grade                        Assessment: II - A patient with mild systemic disease.                        After reviewing the risks and benefits, the patient was                        deemed in satisfactory condition to undergo the                        procedure.                       After obtaining informed consent, the endoscope was                        passed under direct vision. Throughout the procedure,  the patient's blood pressure, pulse, and oxygen                        saturations were monitored continuously. The Olympus                        190 Endoscope [id number]) was introduced through the                        mouth, and  advanced to the second part of duodenum. The                        upper GI endoscopy was accomplished without difficulty.                        The patient tolerated the procedure well. Findings:      One moderate benign-appearing, intrinsic stenosis was found in the lower       third of the esophagus. And was traversed. A TTS dilator was passed       through the scope. Dilation with a 12-13.5-15 mm balloon dilator was       performed to 15 mm. The dilation site was examined following endoscope       reinsertion and showed complete resolution of luminal narrowing.      Localized moderate inflammation characterized by erythema was found in       the gastric antrum. Biopsies were taken with a cold forceps for       histology.      The examined duodenum was normal. Impression:           - Benign-appearing esophageal stenosis. Dilated.                       - Gastritis. Biopsied.                       - Normal examined duodenum. Recommendation:       - Discharge patient to home.                       - Resume previous diet.                       - Continue present medications. Procedure Code(s):    --- Professional ---                       [phone], Esophagogastroduodenoscopy, flexible, transoral;                        with transendoscopic balloon dilation of esophagus                        (less than 30 mm diameter)                       43239, Esophagogastroduodenoscopy, flexible, transoral;                        with biopsy, single or multiple Diagnosis Code(s):    --- Professional ---                       R13.10, Dysphagia, unspecified  K29.70, Gastritis, unspecified, without bleeding                       K22.2, Esophageal obstruction CPT copyright 2016 American Medical Association. All rights reserved. The codes documented in this report are preliminary and upon coder review may  be revised to meet current compliance requirements. [name] MD,  MD 11/20/2016 10:54:22 AM This report has been signed electronically. Number of Addenda: 0 Note Initiated On: 11/20/2016 10:37 AM Total Procedure Duration: 0 hours 4 minutes 20 seconds       [facility]

## 2016-11-20 NOTE — Anesthesia Procedure Notes (Signed)
Procedure Name: MAC Date/Time: 11/20/2016 10:39 AM Performed by: Janna Arch Pre-anesthesia Checklist: Patient identified, Emergency Drugs available, Suction available and Patient being monitored Patient Re-evaluated:Patient Re-evaluated prior to inductionOxygen Delivery Method: Nasal cannula

## 2016-11-20 NOTE — Transfer of Care (Signed)
Immediate Anesthesia Transfer of Care Note  Patient: Carolyn Lewis  Procedure(s) Performed: Procedure(s): ESOPHAGOGASTRODUODENOSCOPY (EGD) WITH PROPOFOL (N/A) ESOPHAGEAL DILATION  Patient Location: PACU  Anesthesia Type: MAC  Level of Consciousness: awake, alert  and patient cooperative  Airway and Oxygen Therapy: Patient Spontanous Breathing and Patient connected to supplemental oxygen  Post-op Assessment: Post-op Vital signs reviewed, Patient's Cardiovascular Status Stable, Respiratory Function Stable, Patent Airway and No signs of Nausea or vomiting  Post-op Vital Signs: Reviewed and stable  Complications: No apparent anesthesia complications

## 2016-11-20 NOTE — Anesthesia Preprocedure Evaluation (Signed)
Anesthesia Evaluation  Patient identified by MRN, date of birth, ID band Patient awake    Reviewed: Allergy & Precautions, H&P , NPO status , Patient's Chart, lab work & pertinent test results  Airway Mallampati: II  TM Distance: >3 FB Neck ROM: full    Dental  (+) Upper Dentures, Lower Dentures   Pulmonary shortness of breath, COPD,  COPD inhaler and oxygen dependent, former smoker,    Pulmonary exam normal        Cardiovascular hypertension, + Past MI and + Peripheral Vascular Disease  Normal cardiovascular exam     Neuro/Psych    GI/Hepatic GERD  ,  Endo/Other    Renal/GU Renal disease     Musculoskeletal   Abdominal   Peds  Hematology   Anesthesia Other Findings   Reproductive/Obstetrics                             Anesthesia Physical Anesthesia Plan  ASA: IV  Anesthesia Plan: MAC   Post-op Pain Management:    Induction:   Airway Management Planned:   Additional Equipment:   Intra-op Plan:   Post-operative Plan:   Informed Consent: I have reviewed the patients History and Physical, chart, labs and discussed the procedure including the risks, benefits and alternatives for the proposed anesthesia with the patient or authorized representative who has indicated his/her understanding and acceptance.     Plan Discussed with:   Anesthesia Plan Comments:         Anesthesia Quick Evaluation

## 2016-11-20 NOTE — Discharge Instructions (Signed)

## 2016-11-25 ENCOUNTER — Encounter: Payer: Self-pay | Admitting: Gastroenterology

## 2016-12-02 DIAGNOSIS — K222 Esophageal obstruction: Secondary | ICD-10-CM | POA: Diagnosis not present

## 2016-12-02 DIAGNOSIS — I209 Angina pectoris, unspecified: Secondary | ICD-10-CM | POA: Diagnosis not present

## 2016-12-02 DIAGNOSIS — I714 Abdominal aortic aneurysm, without rupture: Secondary | ICD-10-CM | POA: Diagnosis not present

## 2016-12-02 DIAGNOSIS — B0221 Postherpetic geniculate ganglionitis: Secondary | ICD-10-CM | POA: Diagnosis not present

## 2016-12-08 DIAGNOSIS — K222 Esophageal obstruction: Secondary | ICD-10-CM | POA: Diagnosis not present

## 2016-12-08 DIAGNOSIS — M792 Neuralgia and neuritis, unspecified: Secondary | ICD-10-CM | POA: Diagnosis not present

## 2016-12-08 DIAGNOSIS — J449 Chronic obstructive pulmonary disease, unspecified: Secondary | ICD-10-CM | POA: Diagnosis not present

## 2016-12-08 DIAGNOSIS — I739 Peripheral vascular disease, unspecified: Secondary | ICD-10-CM | POA: Diagnosis not present

## 2016-12-12 DIAGNOSIS — J449 Chronic obstructive pulmonary disease, unspecified: Secondary | ICD-10-CM | POA: Diagnosis not present

## 2016-12-29 DIAGNOSIS — G43119 Migraine with aura, intractable, without status migrainosus: Secondary | ICD-10-CM | POA: Diagnosis not present

## 2016-12-29 DIAGNOSIS — R011 Cardiac murmur, unspecified: Secondary | ICD-10-CM | POA: Diagnosis not present

## 2016-12-29 DIAGNOSIS — J180 Bronchopneumonia, unspecified organism: Secondary | ICD-10-CM | POA: Diagnosis not present

## 2016-12-29 DIAGNOSIS — G43709 Chronic migraine without aura, not intractable, without status migrainosus: Secondary | ICD-10-CM | POA: Diagnosis not present

## 2016-12-31 DIAGNOSIS — R011 Cardiac murmur, unspecified: Secondary | ICD-10-CM | POA: Diagnosis not present

## 2016-12-31 DIAGNOSIS — G43119 Migraine with aura, intractable, without status migrainosus: Secondary | ICD-10-CM | POA: Diagnosis not present

## 2016-12-31 DIAGNOSIS — J180 Bronchopneumonia, unspecified organism: Secondary | ICD-10-CM | POA: Diagnosis not present

## 2016-12-31 DIAGNOSIS — R000 Tachycardia, unspecified: Secondary | ICD-10-CM | POA: Diagnosis not present

## 2017-01-11 DIAGNOSIS — J449 Chronic obstructive pulmonary disease, unspecified: Secondary | ICD-10-CM | POA: Diagnosis not present

## 2017-02-09 DIAGNOSIS — R062 Wheezing: Secondary | ICD-10-CM | POA: Diagnosis not present

## 2017-02-09 DIAGNOSIS — J449 Chronic obstructive pulmonary disease, unspecified: Secondary | ICD-10-CM | POA: Diagnosis not present

## 2017-02-09 DIAGNOSIS — J219 Acute bronchiolitis, unspecified: Secondary | ICD-10-CM | POA: Diagnosis not present

## 2017-02-09 DIAGNOSIS — I10 Essential (primary) hypertension: Secondary | ICD-10-CM | POA: Diagnosis not present

## 2017-02-11 DIAGNOSIS — J449 Chronic obstructive pulmonary disease, unspecified: Secondary | ICD-10-CM | POA: Diagnosis not present

## 2017-02-13 DIAGNOSIS — M792 Neuralgia and neuritis, unspecified: Secondary | ICD-10-CM | POA: Diagnosis not present

## 2017-02-13 DIAGNOSIS — R29818 Other symptoms and signs involving the nervous system: Secondary | ICD-10-CM | POA: Diagnosis not present

## 2017-02-13 DIAGNOSIS — J449 Chronic obstructive pulmonary disease, unspecified: Secondary | ICD-10-CM | POA: Diagnosis not present

## 2017-02-13 DIAGNOSIS — R296 Repeated falls: Secondary | ICD-10-CM | POA: Diagnosis not present

## 2017-03-13 DIAGNOSIS — J449 Chronic obstructive pulmonary disease, unspecified: Secondary | ICD-10-CM | POA: Diagnosis not present

## 2017-04-13 DIAGNOSIS — J449 Chronic obstructive pulmonary disease, unspecified: Secondary | ICD-10-CM | POA: Diagnosis not present

## 2017-05-14 DIAGNOSIS — J449 Chronic obstructive pulmonary disease, unspecified: Secondary | ICD-10-CM | POA: Diagnosis not present

## 2017-05-25 DIAGNOSIS — I10 Essential (primary) hypertension: Secondary | ICD-10-CM | POA: Diagnosis not present

## 2017-05-25 DIAGNOSIS — J209 Acute bronchitis, unspecified: Secondary | ICD-10-CM | POA: Diagnosis not present

## 2017-05-25 DIAGNOSIS — N182 Chronic kidney disease, stage 2 (mild): Secondary | ICD-10-CM | POA: Diagnosis not present

## 2017-05-25 DIAGNOSIS — J440 Chronic obstructive pulmonary disease with acute lower respiratory infection: Secondary | ICD-10-CM | POA: Diagnosis not present

## 2017-06-13 DIAGNOSIS — J449 Chronic obstructive pulmonary disease, unspecified: Secondary | ICD-10-CM | POA: Diagnosis not present

## 2017-07-14 DIAGNOSIS — J449 Chronic obstructive pulmonary disease, unspecified: Secondary | ICD-10-CM | POA: Diagnosis not present

## 2017-07-27 DIAGNOSIS — J209 Acute bronchitis, unspecified: Secondary | ICD-10-CM | POA: Diagnosis not present

## 2017-07-27 DIAGNOSIS — N182 Chronic kidney disease, stage 2 (mild): Secondary | ICD-10-CM | POA: Diagnosis not present

## 2017-07-27 DIAGNOSIS — J440 Chronic obstructive pulmonary disease with acute lower respiratory infection: Secondary | ICD-10-CM | POA: Diagnosis not present

## 2017-07-27 DIAGNOSIS — I739 Peripheral vascular disease, unspecified: Secondary | ICD-10-CM | POA: Diagnosis not present

## 2017-08-13 DIAGNOSIS — J449 Chronic obstructive pulmonary disease, unspecified: Secondary | ICD-10-CM | POA: Diagnosis not present

## 2017-08-14 DIAGNOSIS — I714 Abdominal aortic aneurysm, without rupture: Secondary | ICD-10-CM | POA: Diagnosis not present

## 2017-08-14 DIAGNOSIS — N182 Chronic kidney disease, stage 2 (mild): Secondary | ICD-10-CM | POA: Diagnosis not present

## 2017-08-14 DIAGNOSIS — J440 Chronic obstructive pulmonary disease with acute lower respiratory infection: Secondary | ICD-10-CM | POA: Diagnosis not present

## 2017-08-14 DIAGNOSIS — J209 Acute bronchitis, unspecified: Secondary | ICD-10-CM | POA: Diagnosis not present

## 2017-09-13 DIAGNOSIS — J449 Chronic obstructive pulmonary disease, unspecified: Secondary | ICD-10-CM | POA: Diagnosis not present

## 2017-09-14 DIAGNOSIS — J449 Chronic obstructive pulmonary disease, unspecified: Secondary | ICD-10-CM | POA: Diagnosis not present

## 2017-09-14 DIAGNOSIS — G43119 Migraine with aura, intractable, without status migrainosus: Secondary | ICD-10-CM | POA: Diagnosis not present

## 2017-09-14 DIAGNOSIS — M792 Neuralgia and neuritis, unspecified: Secondary | ICD-10-CM | POA: Diagnosis not present

## 2017-09-14 DIAGNOSIS — I10 Essential (primary) hypertension: Secondary | ICD-10-CM | POA: Diagnosis not present

## 2017-09-16 DIAGNOSIS — J449 Chronic obstructive pulmonary disease, unspecified: Secondary | ICD-10-CM | POA: Diagnosis not present

## 2017-09-16 DIAGNOSIS — J9612 Chronic respiratory failure with hypercapnia: Secondary | ICD-10-CM | POA: Diagnosis not present

## 2017-10-03 DIAGNOSIS — S51811A Laceration without foreign body of right forearm, initial encounter: Secondary | ICD-10-CM | POA: Diagnosis not present

## 2017-10-03 DIAGNOSIS — R21 Rash and other nonspecific skin eruption: Secondary | ICD-10-CM | POA: Diagnosis not present

## 2017-10-05 DIAGNOSIS — S51811D Laceration without foreign body of right forearm, subsequent encounter: Secondary | ICD-10-CM | POA: Diagnosis not present

## 2017-10-05 DIAGNOSIS — Z4800 Encounter for change or removal of nonsurgical wound dressing: Secondary | ICD-10-CM | POA: Diagnosis not present

## 2017-10-07 DIAGNOSIS — Z4800 Encounter for change or removal of nonsurgical wound dressing: Secondary | ICD-10-CM | POA: Diagnosis not present

## 2017-10-07 DIAGNOSIS — S51811D Laceration without foreign body of right forearm, subsequent encounter: Secondary | ICD-10-CM | POA: Diagnosis not present

## 2017-10-10 DIAGNOSIS — S51801A Unspecified open wound of right forearm, initial encounter: Secondary | ICD-10-CM | POA: Diagnosis not present

## 2017-10-10 DIAGNOSIS — S51811D Laceration without foreign body of right forearm, subsequent encounter: Secondary | ICD-10-CM | POA: Diagnosis not present

## 2017-10-10 DIAGNOSIS — Z4800 Encounter for change or removal of nonsurgical wound dressing: Secondary | ICD-10-CM | POA: Diagnosis not present

## 2017-10-12 DIAGNOSIS — Z4800 Encounter for change or removal of nonsurgical wound dressing: Secondary | ICD-10-CM | POA: Diagnosis not present

## 2017-10-12 DIAGNOSIS — S51811D Laceration without foreign body of right forearm, subsequent encounter: Secondary | ICD-10-CM | POA: Diagnosis not present

## 2017-10-15 DIAGNOSIS — Z4800 Encounter for change or removal of nonsurgical wound dressing: Secondary | ICD-10-CM | POA: Diagnosis not present

## 2017-10-15 DIAGNOSIS — S51811D Laceration without foreign body of right forearm, subsequent encounter: Secondary | ICD-10-CM | POA: Diagnosis not present

## 2017-10-17 DIAGNOSIS — S51811D Laceration without foreign body of right forearm, subsequent encounter: Secondary | ICD-10-CM | POA: Diagnosis not present

## 2017-10-17 DIAGNOSIS — S51801D Unspecified open wound of right forearm, subsequent encounter: Secondary | ICD-10-CM | POA: Diagnosis not present

## 2017-10-17 DIAGNOSIS — J449 Chronic obstructive pulmonary disease, unspecified: Secondary | ICD-10-CM | POA: Diagnosis not present

## 2017-11-09 DIAGNOSIS — J449 Chronic obstructive pulmonary disease, unspecified: Secondary | ICD-10-CM | POA: Diagnosis not present

## 2017-11-14 DIAGNOSIS — J449 Chronic obstructive pulmonary disease, unspecified: Secondary | ICD-10-CM | POA: Diagnosis not present

## 2017-12-15 DIAGNOSIS — J449 Chronic obstructive pulmonary disease, unspecified: Secondary | ICD-10-CM | POA: Diagnosis not present

## 2017-12-29 DIAGNOSIS — N182 Chronic kidney disease, stage 2 (mild): Secondary | ICD-10-CM | POA: Diagnosis not present

## 2017-12-29 DIAGNOSIS — I10 Essential (primary) hypertension: Secondary | ICD-10-CM | POA: Diagnosis not present

## 2017-12-29 DIAGNOSIS — I714 Abdominal aortic aneurysm, without rupture: Secondary | ICD-10-CM | POA: Diagnosis not present

## 2017-12-29 DIAGNOSIS — J449 Chronic obstructive pulmonary disease, unspecified: Secondary | ICD-10-CM | POA: Diagnosis not present

## 2018-01-13 ENCOUNTER — Other Ambulatory Visit: Payer: Self-pay

## 2018-01-13 ENCOUNTER — Telehealth: Payer: Self-pay | Admitting: Gastroenterology

## 2018-01-13 DIAGNOSIS — R1310 Dysphagia, unspecified: Secondary | ICD-10-CM

## 2018-01-13 NOTE — Telephone Encounter (Signed)
Patient is having trouble swallowing. EGD or office appt? They don't feel she can wait until July.

## 2018-01-13 NOTE — Telephone Encounter (Signed)
Pt scheduled for an EGD with Wohl at [facility] on 01/21/18.

## 2018-01-14 DIAGNOSIS — J449 Chronic obstructive pulmonary disease, unspecified: Secondary | ICD-10-CM | POA: Diagnosis not present

## 2018-01-15 NOTE — Anesthesia Preprocedure Evaluation (Addendum)
Anesthesia Evaluation  Patient identified by MRN, date of birth, ID band Patient awake    Reviewed: Allergy & Precautions, NPO status , Patient's Chart, lab work & pertinent test results  History of Anesthesia Complications Negative for: history of anesthetic complications  Airway Mallampati: III  TM Distance: >3 FB Neck ROM: Full    Dental  (+) Lower Dentures, Upper Dentures   Pulmonary COPD,  oxygen dependent, former smoker (quit 2017),  2l home O2    + wheezing      Cardiovascular hypertension, + Past MI (2011), + Cardiac Stents and + Peripheral Vascular Disease  Normal cardiovascular exam Rhythm:Regular Rate:Normal  2 Cardiac stents placed in 2011 2-D Echo in 2016, but I was unable to locate report.  AAA (less than 5.5cm)   Neuro/Psych PSYCHIATRIC DISORDERS Anxiety negative neurological ROS     GI/Hepatic GERD  ,Hx esophageal strictures   Endo/Other  negative endocrine ROS  Renal/GU CRFRenal disease (stage III CKD)     Musculoskeletal  (+) Arthritis ,   Abdominal   Peds  Hematology negative hematology ROS (+)   Anesthesia Other Findings   Reproductive/Obstetrics                         Anesthesia Physical Anesthesia Plan  ASA: IV  Anesthesia Plan: General   Post-op Pain Management:    Induction: Intravenous  PONV Risk Score and Plan: 3 and TIVA and Propofol infusion  Airway Management Planned: Natural Airway  Additional Equipment:   Intra-op Plan:   Post-operative Plan:   Informed Consent: I have reviewed the patients History and Physical, chart, labs and discussed the procedure including the risks, benefits and alternatives for the proposed anesthesia with the patient or authorized representative who has indicated his/her understanding and acceptance.     Plan Discussed with: CRNA  Anesthesia Plan Comments: (Considering pt's poor pulmonary status, discussed risk of  intubation and subsequent prolonged intubation with patient and husband.)        Anesthesia Quick Evaluation

## 2018-01-19 ENCOUNTER — Other Ambulatory Visit: Payer: Self-pay

## 2018-01-19 DIAGNOSIS — Z136 Encounter for screening for cardiovascular disorders: Secondary | ICD-10-CM | POA: Diagnosis not present

## 2018-01-20 NOTE — Discharge Instructions (Signed)
General Anesthesia, Adult, Care After °These instructions provide you with information about caring for yourself after your procedure. Your health care provider may also give you more specific instructions. Your treatment has been planned according to current medical practices, but problems sometimes occur. Call your health care provider if you have any problems or questions after your procedure. °What can I expect after the procedure? °After the procedure, it is common to have: °· Vomiting. °· A sore throat. °· Mental slowness. ° °It is common to feel: °· Nauseous. °· Cold or shivery. °· Sleepy. °· Tired. °· Sore or achy, even in parts of your body where you did not have surgery. ° °Follow these instructions at home: °For at least 24 hours after the procedure: °· Do not: °? Participate in activities where you could fall or become injured. °? Drive. °? Use heavy machinery. °? Drink alcohol. °? Take sleeping pills or medicines that cause drowsiness. °? Make important decisions or sign legal documents. °? Take care of children on your own. °· Rest. °Eating and drinking °· If you vomit, drink water, juice, or soup when you can drink without vomiting. °· Drink enough fluid to keep your urine clear or pale yellow. °· Make sure you have little or no nausea before eating solid foods. °· Follow the diet recommended by your health care provider. °General instructions °· Have a responsible adult stay with you until you are awake and alert. °· Return to your normal activities as told by your health care provider. Ask your health care provider what activities are safe for you. °· Take over-the-counter and prescription medicines only as told by your health care provider. °· If you smoke, do not smoke without supervision. °· Keep all follow-up visits as told by your health care provider. This is important. °Contact a health care provider if: °· You continue to have nausea or vomiting at home, and medicines are not helpful. °· You  cannot drink fluids or start eating again. °· You cannot urinate after 8-12 hours. °· You develop a skin rash. °· You have fever. °· You have increasing redness at the site of your procedure. °Get help right away if: °· You have difficulty breathing. °· You have chest pain. °· You have unexpected bleeding. °· You feel that you are having a life-threatening or urgent problem. °This information is not intended to replace advice given to you by your health care provider. Make sure you discuss any questions you have with your health care provider. °Document Released: 11/17/2000 Document Revised: 01/14/2016 Document Reviewed: 07/26/2015 °Elsevier Interactive Patient Education © 2018 Elsevier Inc. ° °

## 2018-01-21 ENCOUNTER — Encounter
Admission: RE | Disposition: A | Discharge status: Home / Self Care | Payer: Self-pay | Source: Ambulatory Visit | Attending: Gastroenterology

## 2018-01-21 ENCOUNTER — Ambulatory Visit: Payer: Medicare Other | Admitting: Anesthesiology

## 2018-01-21 ENCOUNTER — Ambulatory Visit
Admission: RE | Admit: 2018-01-21 | Discharge: 2018-01-21 | Disposition: A | Discharge status: Home / Self Care | Payer: Medicare Other | Source: Ambulatory Visit | Attending: Gastroenterology | Admitting: Gastroenterology

## 2018-01-21 DIAGNOSIS — K219 Gastro-esophageal reflux disease without esophagitis: Secondary | ICD-10-CM | POA: Diagnosis not present

## 2018-01-21 DIAGNOSIS — F419 Anxiety disorder, unspecified: Secondary | ICD-10-CM | POA: Diagnosis not present

## 2018-01-21 DIAGNOSIS — Z9981 Dependence on supplemental oxygen: Secondary | ICD-10-CM | POA: Insufficient documentation

## 2018-01-21 DIAGNOSIS — N183 Chronic kidney disease, stage 3 (moderate): Secondary | ICD-10-CM | POA: Insufficient documentation

## 2018-01-21 DIAGNOSIS — Z955 Presence of coronary angioplasty implant and graft: Secondary | ICD-10-CM | POA: Insufficient documentation

## 2018-01-21 DIAGNOSIS — I129 Hypertensive chronic kidney disease with stage 1 through stage 4 chronic kidney disease, or unspecified chronic kidney disease: Secondary | ICD-10-CM | POA: Diagnosis not present

## 2018-01-21 DIAGNOSIS — I252 Old myocardial infarction: Secondary | ICD-10-CM | POA: Insufficient documentation

## 2018-01-21 DIAGNOSIS — R1310 Dysphagia, unspecified: Secondary | ICD-10-CM | POA: Diagnosis not present

## 2018-01-21 DIAGNOSIS — I739 Peripheral vascular disease, unspecified: Secondary | ICD-10-CM | POA: Diagnosis not present

## 2018-01-21 DIAGNOSIS — K222 Esophageal obstruction: Secondary | ICD-10-CM | POA: Insufficient documentation

## 2018-01-21 DIAGNOSIS — Z7902 Long term (current) use of antithrombotics/antiplatelets: Secondary | ICD-10-CM | POA: Diagnosis not present

## 2018-01-21 DIAGNOSIS — J449 Chronic obstructive pulmonary disease, unspecified: Secondary | ICD-10-CM | POA: Insufficient documentation

## 2018-01-21 DIAGNOSIS — Z87891 Personal history of nicotine dependence: Secondary | ICD-10-CM | POA: Diagnosis not present

## 2018-01-21 DIAGNOSIS — Z79899 Other long term (current) drug therapy: Secondary | ICD-10-CM | POA: Diagnosis not present

## 2018-01-21 HISTORY — PX: ESOPHAGOGASTRODUODENOSCOPY (EGD) WITH PROPOFOL: SHX5813

## 2018-01-21 HISTORY — DX: Unspecified osteoarthritis, unspecified site: M19.90

## 2018-01-21 HISTORY — PX: ESOPHAGEAL DILATION: SHX303

## 2018-01-21 SURGERY — ESOPHAGOGASTRODUODENOSCOPY (EGD) WITH PROPOFOL
Anesthesia: General

## 2018-01-21 MED ORDER — STERILE WATER FOR IRRIGATION IR SOLN
Status: DC | PRN
  Administered 2018-01-21: 12:00:00

## 2018-01-21 MED ORDER — PROPOFOL 10 MG/ML IV BOLUS
INTRAVENOUS | Status: DC | PRN
  Administered 2018-01-21 (×2): 10 mg via INTRAVENOUS
  Administered 2018-01-21: 60 mg via INTRAVENOUS
  Administered 2018-01-21 (×2): 10 mg via INTRAVENOUS

## 2018-01-21 MED ORDER — ONDANSETRON HCL 4 MG/2ML IJ SOLN: 4.0000 mg | Freq: Once | INTRAMUSCULAR | Status: DC | PRN

## 2018-01-21 MED ORDER — ACETAMINOPHEN 325 MG PO TABS: 650.0000 mg | ORAL_TABLET | Freq: Once | ORAL | Status: DC | PRN

## 2018-01-21 MED ORDER — ACETAMINOPHEN 160 MG/5ML PO SOLN: 325.0000 mg | ORAL | Status: DC | PRN

## 2018-01-21 MED ORDER — ONDANSETRON HCL 4 MG/2ML IJ SOLN
4.0000 mg | Freq: Once | INTRAMUSCULAR | Status: AC
  Administered 2018-01-21: 4 mg via INTRAVENOUS

## 2018-01-21 MED ORDER — GLYCOPYRROLATE 0.2 MG/ML IJ SOLN
INTRAMUSCULAR | Status: DC | PRN
  Administered 2018-01-21: 0.1 mg via INTRAVENOUS

## 2018-01-21 MED ORDER — ALBUTEROL SULFATE (2.5 MG/3ML) 0.083% IN NEBU
2.5000 mg | INHALATION_SOLUTION | Freq: Once | RESPIRATORY_TRACT | Status: AC
  Administered 2018-01-21: 2.5 mg via RESPIRATORY_TRACT

## 2018-01-21 MED ORDER — LACTATED RINGERS IV SOLN
INTRAVENOUS | Status: DC
  Administered 2018-01-21 (×2): via INTRAVENOUS

## 2018-01-21 MED ORDER — SODIUM CHLORIDE 0.9 % IV SOLN: INTRAVENOUS | Status: DC

## 2018-01-21 MED ORDER — LIDOCAINE HCL (CARDIAC) PF 100 MG/5ML IV SOSY
PREFILLED_SYRINGE | INTRAVENOUS | Status: DC | PRN
  Administered 2018-01-21: 20 mg via INTRAVENOUS

## 2018-01-21 SURGICAL SUPPLY — 33 items
BALLN DILATOR 10-12 8 (BALLOONS) ×2
BALLN DILATOR 12-15 8 (BALLOONS)
BALLN DILATOR 15-18 8 (BALLOONS)
BALLN DILATOR CRE 0-12 8 (BALLOONS) ×1
BALLN DILATOR ESOPH 8 10 CRE (MISCELLANEOUS) ×3 IMPLANT
BALLOON DILATOR 12-15 8 (BALLOONS) IMPLANT
BALLOON DILATOR 15-18 8 (BALLOONS) IMPLANT
BALLOON DILATOR CRE 0-12 8 (BALLOONS) ×1 IMPLANT
BLOCK BITE 60FR ADLT L/F GRN (MISCELLANEOUS) ×3 IMPLANT
CANISTER SUCT 1200ML W/VALVE (MISCELLANEOUS) ×3 IMPLANT
CLIP HMST 235XBRD CATH ROT (MISCELLANEOUS) IMPLANT
CLIP RESOLUTION 360 11X235 (MISCELLANEOUS)
ELECT REM PT RETURN 9FT ADLT (ELECTROSURGICAL)
ELECTRODE REM PT RTRN 9FT ADLT (ELECTROSURGICAL) IMPLANT
FCP ESCP3.2XJMB 240X2.8X (MISCELLANEOUS)
FORCEPS BIOP RAD 4 LRG CAP 4 (CUTTING FORCEPS) IMPLANT
FORCEPS BIOP RJ4 240 W/NDL (MISCELLANEOUS)
FORCEPS ESCP3.2XJMB 240X2.8X (MISCELLANEOUS) IMPLANT
GOWN CVR UNV OPN BCK APRN NK (MISCELLANEOUS) ×2 IMPLANT
GOWN ISOL THUMB LOOP REG UNIV (MISCELLANEOUS) ×4
INJECTOR VARIJECT VIN23 (MISCELLANEOUS) IMPLANT
KIT DEFENDO VALVE AND CONN (KITS) IMPLANT
KIT ENDO PROCEDURE OLY (KITS) ×3 IMPLANT
MARKER SPOT ENDO TATTOO 5ML (MISCELLANEOUS) IMPLANT
RETRIEVER NET PLAT FOOD (MISCELLANEOUS) IMPLANT
SNARE SHORT THROW 13M SML OVAL (MISCELLANEOUS) IMPLANT
SNARE SHORT THROW 30M LRG OVAL (MISCELLANEOUS) IMPLANT
SPOT EX ENDOSCOPIC TATTOO (MISCELLANEOUS)
SYR INFLATION 60ML (SYRINGE) ×3 IMPLANT
TRAP ETRAP POLY (MISCELLANEOUS) IMPLANT
VARIJECT INJECTOR VIN23 (MISCELLANEOUS)
WATER STERILE IRR 250ML POUR (IV SOLUTION) ×3 IMPLANT
WIRE CRE 18-20MM 8CM F G (MISCELLANEOUS) IMPLANT

## 2018-01-21 NOTE — Anesthesia Postprocedure Evaluation (Signed)
Anesthesia Post Note  Patient: Carolyn Lewis  Procedure(s) Performed: ESOPHAGOGASTRODUODENOSCOPY (EGD) WITH PROPOFOL (N/A ) ESOPHAGEAL DILATION (N/A )  Patient location during evaluation: PACU Anesthesia Type: General Level of consciousness: awake and alert, oriented and patient cooperative Pain management: pain level controlled Vital Signs Assessment: post-procedure vital signs reviewed and stable Respiratory status: spontaneous breathing, nonlabored ventilation and respiratory function stable Cardiovascular status: blood pressure returned to baseline and stable Postop Assessment: adequate PO intake Anesthetic complications: no    Reed Breech

## 2018-01-21 NOTE — Transfer of Care (Signed)
Immediate Anesthesia Transfer of Care Note  Patient: Carolyn Lewis  Procedure(s) Performed: ESOPHAGOGASTRODUODENOSCOPY (EGD) WITH PROPOFOL (N/A ) ESOPHAGEAL DILATION (N/A )  Patient Location: PACU  Anesthesia Type: General  Level of Consciousness: awake, alert  and patient cooperative  Airway and Oxygen Therapy: Patient Spontanous Breathing and Patient connected to supplemental oxygen  Post-op Assessment: Post-op Vital signs reviewed, Patient's Cardiovascular Status Stable, Respiratory Function Stable, Patent Airway and No signs of Nausea or vomiting  Post-op Vital Signs: Reviewed and stable  Complications: No apparent anesthesia complications

## 2018-01-21 NOTE — H&P (Signed)
[name], MD [facility] [address], [address] [place], [place] [address] Phone:[phone] Fax : [phone]  Primary Care Physician:  Corky Downs, MD Primary Gastroenterologist:  Dr. Servando Snare  Pre-Procedure History & Physical: HPI:  Carolyn Lewis is a 77 y.o. female is here for an endoscopy.   Past Medical History:  Diagnosis Date  . AAA (abdominal aortic aneurysm) (HCC)   . Acute MI [facility])    cath with 2 stents placed  . Anxiety   . Arthritis   . CKD stage G3b/A3, GFR 30 - 44 and albumin creatinine ratio >300 mg/g   . COPD (chronic obstructive pulmonary disease) (HCC)   . Dyspnea   . GERD (gastroesophageal reflux disease)   . Headache    migraines  . Hypertension   . Presence of stent in artery 01/2010   2 cardiac stents  . Wears dentures    full upper and lower    Past Surgical History:  Procedure Laterality Date  . ABDOMINAL HYSTERECTOMY    . CARDIAC CATHETERIZATION  2011  . ESOPHAGEAL DILATION  11/20/2016   Procedure: ESOPHAGEAL DILATION;  Surgeon: [name], MD;  Location: [facility];  Service: Endoscopy;;  . ESOPHAGOGASTRODUODENOSCOPY (EGD) WITH PROPOFOL N/A 11/20/2016   Procedure: ESOPHAGOGASTRODUODENOSCOPY (EGD) WITH PROPOFOL;  Surgeon: [name], MD;  Location[facility];  Service: Endoscopy;  Laterality: N/A;    Prior to Admission medications   Medication Sig Start Date End Date Taking? Authorizing Provider  amitriptyline (ELAVIL) 25 MG tablet Take 25 mg by mouth at bedtime.   Yes [provider]  amLODipine (NORVASC) 5 MG tablet Take 5 mg by mouth daily.   Yes [provider]  atorvastatin (LIPITOR) 40 MG tablet Take 40 mg by mouth daily.   Yes [provider]  diazepam (VALIUM) 2 MG tablet Take 2 mg by mouth 2 (two) times daily.   Yes [provider]  enalapril (VASOTEC) 2.5 MG tablet Take 2.5 mg by mouth daily.   Yes [provider]  famotidine (PEPCID) 20 MG tablet Take 20 mg by mouth  daily.   Yes [provider]  furosemide (LASIX) 20 MG tablet Take 20 mg by mouth daily. Patient only takes Monday, Wednesday, and Friday.    Yes [provider]  Ipratropium-Albuterol (COMBIVENT RESPIMAT) 20-100 MCG/ACT AERS respimat Inhale 1 puff into the lungs 3 (three) times daily as needed for wheezing or shortness of breath.   Yes [provider]  levalbuterol (XOPENEX) 1.25 MG/3ML nebulizer solution Take 1.25 mg by nebulization every 4 (four) hours as needed for wheezing or shortness of breath.   Yes [provider]  metoprolol (LOPRESSOR) 50 MG tablet Take 1 tablet (50 mg total) by mouth 2 (two) times daily. 02/03/15  Yes Mody, Patricia Pesa, MD  pantoprazole (PROTONIX) 40 MG tablet Take 40 mg by mouth daily.   Yes [provider]  clopidogrel (PLAVIX) 75 MG tablet Take 75 mg by mouth daily.    [provider]  nitroGLYCERIN (NITROSTAT) 0.4 MG SL tablet Place 0.4 mg under the tongue every 5 (five) minutes as needed for chest pain.    [provider]  traMADol (ULTRAM) 50 MG tablet Take 50 mg by mouth 2 (two) times daily.    [provider]    Allergies as of 01/13/2018 - Review Complete 11/20/2016  Allergen Reaction Noted  . Sulfa antibiotics Rash 01/31/2015    History reviewed. No pertinent family history.  Social History   Socioeconomic History  .  Marital status: Married    Spouse name: Not on file  . Number of children: Not on file  . Years of education: Not on file  . Highest education level: Not on file  Occupational History  . Not on file  Social Needs  . Financial resource strain: Not on file  . Food insecurity:    Worry: Not on file    Inability: Not on file  . Transportation needs:    Medical: Not on file    Non-medical: Not on file  Tobacco Use  . Smoking status: Former Smoker    Packs/day: 1.00    Years: 15.00    Pack years: 15.00    Types: Cigarettes    Last attempt to quit: 2015    Years  since quitting: 4.4  . Smokeless tobacco: Never Used  Substance and Sexual Activity  . Alcohol use: No  . Drug use: No  . Sexual activity: Not Currently    Birth control/protection: Post-menopausal  Lifestyle  . Physical activity:    Days per week: Not on file    Minutes per session: Not on file  . Stress: Not on file  Relationships  . Social connections:    Talks on phone: Not on file    Gets together: Not on file    Attends religious service: Not on file    Active member of club or organization: Not on file    Attends meetings of clubs or organizations: Not on file    Relationship status: Not on file  . Intimate partner violence:    Fear of current or ex partner: Not on file    Emotionally abused: Not on file    Physically abused: Not on file    Forced sexual activity: Not on file  Other Topics Concern  . Not on file  Social History Narrative  . Not on file    Review of Systems: See HPI, otherwise negative ROS  Physical Exam: BP (!) 154/73   Pulse 95   Temp 98.1 F (36.7 C) (Temporal)   Resp (!) 25   Ht  (1.676 m)   Wt 123 lb (55.8 kg)   SpO2 99%   BMI 19.85 kg/m  General:   Alert,  pleasant and cooperative in NAD Head:  Normocephalic and atraumatic. Neck:  Supple; no masses or thyromegaly. Lungs:  Clear throughout to auscultation.    Heart:  Regular rate and rhythm. Abdomen:  Soft, nontender and nondistended. Normal bowel sounds, without guarding, and without rebound.   Neurologic:  Alert and  oriented x4;  grossly normal neurologically.  Impression/Plan: Carolyn Lewis is here for an endoscopy to be performed for dysphagia  Risks, benefits, limitations, and alternatives regarding  endoscopy have been reviewed with the patient.  Questions have been answered.  All parties agreeable.   [name], MD  01/21/2018, 10:58 AM

## 2018-01-21 NOTE — Op Note (Signed)
[facility] Gastroenterology Patient Name: Carolyn Lewis Procedure Date: 01/21/2018 11:31 AM MRN: [id number] Account #: [id number] Date of Birth: February 11, 1941 Admit Type: Outpatient Age: 77 Room: [facility] OR ROOM 01 Gender: Female Note Status: Finalized Procedure:            Upper GI endoscopy Indications:          Dysphagia Providers:            [name] MD, MD Referring MD:         Corky Downs, MD (Referring MD) Medicines:            Propofol per Anesthesia Complications:        No immediate complications. Procedure:            Pre-Anesthesia Assessment:                       - Prior to the procedure, a History and Physical was                        performed, and patient medications and allergies were                        reviewed. The patient's tolerance of previous                        anesthesia was also reviewed. The risks and benefits of                        the procedure and the sedation options and risks were                        discussed with the patient. All questions were                        answered, and informed consent was obtained. Prior                        Anticoagulants: The patient has taken no previous                        anticoagulant or antiplatelet agents. ASA Grade                        Assessment: IV - A patient with severe systemic disease                        that is a constant threat to life. After reviewing the                        risks and benefits, the patient was deemed in                        satisfactory condition to undergo the procedure.                       After obtaining informed consent, the endoscope was                        passed under direct vision. Throughout the procedure,  the patient's blood pressure, pulse, and oxygen                        saturations were monitored continuously. The Olympus                        [id number]-HQ190 Endoscope (S#. [id number]) was introduced                         through the mouth, and advanced to the second part of                        duodenum. The upper GI endoscopy was accomplished                        without difficulty. The patient tolerated the procedure                        well. Findings:      One benign-appearing, intrinsic severe stenosis was found at the       gastroesophageal junction. This stenosis measured 8 mm (inner diameter).       The stenosis was traversed after dilation. A TTS dilator was passed       through the scope. Dilation with a 12-13.5-15 mm balloon dilator was       performed to 13.5 mm. The dilation site was examined following endoscope       reinsertion and showed moderate improvement in luminal narrowing.      The stomach was normal.      The examined duodenum was normal. Impression:           - Benign-appearing esophageal stenosis. Dilated.                       - Normal stomach.                       - Normal examined duodenum.                       - No specimens collected. Recommendation:       - Discharge patient to home.                       - Resume previous diet.                       - Continue present medications.                       - Repeat upper endoscopy in 4 weeks for retreatment. Procedure Code(s):    --- Professional ---                       [phone], Esophagogastroduodenoscopy, flexible, transoral;                        with transendoscopic balloon dilation of esophagus                        (less than 30 mm diameter) Diagnosis Code(s):    --- Professional ---  R13.10, Dysphagia, unspecified                       K22.2, Esophageal obstruction CPT copyright 2017 American Medical Association. All rights reserved. The codes documented in this report are preliminary and upon coder review may  be revised to meet current compliance requirements. [name] MD, MD 01/21/2018 11:50:14 AM This report has been signed electronically. Number of Addenda:  0 Note Initiated On: 01/21/2018 11:31 AM      [facility]

## 2018-01-21 NOTE — Anesthesia Procedure Notes (Signed)
Procedure Name: MAC Date/Time: 2020/05/1018 11:43 AM Performed by: Lind Guest, CRNA Pre-anesthesia Checklist: Patient identified, Emergency Drugs available, Suction available, Patient being monitored and Timeout performed Patient Re-evaluated:Patient Re-evaluated prior to induction Oxygen Delivery Method: Nasal cannula

## 2018-01-22 ENCOUNTER — Encounter: Payer: Self-pay | Admitting: Gastroenterology

## 2018-01-29 DIAGNOSIS — I209 Angina pectoris, unspecified: Secondary | ICD-10-CM | POA: Diagnosis not present

## 2018-01-29 DIAGNOSIS — I6522 Occlusion and stenosis of left carotid artery: Secondary | ICD-10-CM | POA: Diagnosis not present

## 2018-01-29 DIAGNOSIS — K222 Esophageal obstruction: Secondary | ICD-10-CM | POA: Diagnosis not present

## 2018-01-29 DIAGNOSIS — R0602 Shortness of breath: Secondary | ICD-10-CM | POA: Diagnosis not present

## 2018-01-29 DIAGNOSIS — R062 Wheezing: Secondary | ICD-10-CM | POA: Diagnosis not present

## 2018-02-08 ENCOUNTER — Other Ambulatory Visit: Payer: Self-pay

## 2018-02-08 ENCOUNTER — Encounter: Payer: Self-pay | Admitting: *Deleted

## 2018-02-08 DIAGNOSIS — R1310 Dysphagia, unspecified: Secondary | ICD-10-CM

## 2018-02-12 NOTE — Discharge Instructions (Signed)
General Anesthesia, Adult, Care After °These instructions provide you with information about caring for yourself after your procedure. Your health care provider may also give you more specific instructions. Your treatment has been planned according to current medical practices, but problems sometimes occur. Call your health care provider if you have any problems or questions after your procedure. °What can I expect after the procedure? °After the procedure, it is common to have: °· Vomiting. °· A sore throat. °· Mental slowness. ° °It is common to feel: °· Nauseous. °· Cold or shivery. °· Sleepy. °· Tired. °· Sore or achy, even in parts of your body where you did not have surgery. ° °Follow these instructions at home: °For at least 24 hours after the procedure: °· Do not: °? Participate in activities where you could fall or become injured. °? Drive. °? Use heavy machinery. °? Drink alcohol. °? Take sleeping pills or medicines that cause drowsiness. °? Make important decisions or sign legal documents. °? Take care of children on your own. °· Rest. °Eating and drinking °· If you vomit, drink water, juice, or soup when you can drink without vomiting. °· Drink enough fluid to keep your urine clear or pale yellow. °· Make sure you have little or no nausea before eating solid foods. °· Follow the diet recommended by your health care provider. °General instructions °· Have a responsible adult stay with you until you are awake and alert. °· Return to your normal activities as told by your health care provider. Ask your health care provider what activities are safe for you. °· Take over-the-counter and prescription medicines only as told by your health care provider. °· If you smoke, do not smoke without supervision. °· Keep all follow-up visits as told by your health care provider. This is important. °Contact a health care provider if: °· You continue to have nausea or vomiting at home, and medicines are not helpful. °· You  cannot drink fluids or start eating again. °· You cannot urinate after 8-12 hours. °· You develop a skin rash. °· You have fever. °· You have increasing redness at the site of your procedure. °Get help right away if: °· You have difficulty breathing. °· You have chest pain. °· You have unexpected bleeding. °· You feel that you are having a life-threatening or urgent problem. °This information is not intended to replace advice given to you by your health care provider. Make sure you discuss any questions you have with your health care provider. °Document Released: 11/17/2000 Document Revised: 01/14/2016 Document Reviewed: 07/26/2015 °Elsevier Interactive Patient Education © 2018 Elsevier Inc. ° °

## 2018-02-14 DIAGNOSIS — J449 Chronic obstructive pulmonary disease, unspecified: Secondary | ICD-10-CM | POA: Diagnosis not present

## 2018-02-15 ENCOUNTER — Ambulatory Visit: Payer: Medicare Other | Admitting: Anesthesiology

## 2018-02-15 ENCOUNTER — Ambulatory Visit
Admission: RE | Admit: 2018-02-15 | Discharge: 2018-02-15 | Disposition: A | Discharge status: Home / Self Care | Payer: Medicare Other | Source: Ambulatory Visit | Attending: Gastroenterology | Admitting: Gastroenterology

## 2018-02-15 ENCOUNTER — Encounter
Admission: RE | Disposition: A | Discharge status: Home / Self Care | Payer: Self-pay | Source: Ambulatory Visit | Attending: Gastroenterology

## 2018-02-15 DIAGNOSIS — F419 Anxiety disorder, unspecified: Secondary | ICD-10-CM | POA: Diagnosis not present

## 2018-02-15 DIAGNOSIS — J449 Chronic obstructive pulmonary disease, unspecified: Secondary | ICD-10-CM | POA: Diagnosis not present

## 2018-02-15 DIAGNOSIS — K219 Gastro-esophageal reflux disease without esophagitis: Secondary | ICD-10-CM | POA: Insufficient documentation

## 2018-02-15 DIAGNOSIS — K222 Esophageal obstruction: Secondary | ICD-10-CM | POA: Insufficient documentation

## 2018-02-15 DIAGNOSIS — Z9981 Dependence on supplemental oxygen: Secondary | ICD-10-CM | POA: Insufficient documentation

## 2018-02-15 DIAGNOSIS — K2970 Gastritis, unspecified, without bleeding: Secondary | ICD-10-CM | POA: Insufficient documentation

## 2018-02-15 DIAGNOSIS — I739 Peripheral vascular disease, unspecified: Secondary | ICD-10-CM | POA: Insufficient documentation

## 2018-02-15 DIAGNOSIS — I252 Old myocardial infarction: Secondary | ICD-10-CM | POA: Insufficient documentation

## 2018-02-15 DIAGNOSIS — I129 Hypertensive chronic kidney disease with stage 1 through stage 4 chronic kidney disease, or unspecified chronic kidney disease: Secondary | ICD-10-CM | POA: Diagnosis not present

## 2018-02-15 DIAGNOSIS — R1310 Dysphagia, unspecified: Secondary | ICD-10-CM | POA: Diagnosis not present

## 2018-02-15 DIAGNOSIS — I714 Abdominal aortic aneurysm, without rupture: Secondary | ICD-10-CM | POA: Insufficient documentation

## 2018-02-15 DIAGNOSIS — Z882 Allergy status to sulfonamides status: Secondary | ICD-10-CM | POA: Insufficient documentation

## 2018-02-15 DIAGNOSIS — R1319 Other dysphagia: Secondary | ICD-10-CM | POA: Diagnosis not present

## 2018-02-15 DIAGNOSIS — N183 Chronic kidney disease, stage 3 (moderate): Secondary | ICD-10-CM | POA: Diagnosis not present

## 2018-02-15 DIAGNOSIS — Z87891 Personal history of nicotine dependence: Secondary | ICD-10-CM | POA: Insufficient documentation

## 2018-02-15 DIAGNOSIS — Z79899 Other long term (current) drug therapy: Secondary | ICD-10-CM | POA: Diagnosis not present

## 2018-02-15 DIAGNOSIS — Z955 Presence of coronary angioplasty implant and graft: Secondary | ICD-10-CM | POA: Insufficient documentation

## 2018-02-15 HISTORY — PX: ESOPHAGOGASTRODUODENOSCOPY (EGD) WITH PROPOFOL: SHX5813

## 2018-02-15 HISTORY — PX: ESOPHAGEAL DILATION: SHX303

## 2018-02-15 SURGERY — ESOPHAGOGASTRODUODENOSCOPY (EGD) WITH PROPOFOL
Anesthesia: General | Wound class: Clean Contaminated

## 2018-02-15 MED ORDER — STERILE WATER FOR IRRIGATION IR SOLN
Status: DC | PRN
  Administered 2018-02-15: 5 mL

## 2018-02-15 MED ORDER — ONDANSETRON HCL 4 MG/2ML IJ SOLN: 4.0000 mg | Freq: Once | INTRAMUSCULAR | Status: DC | PRN

## 2018-02-15 MED ORDER — PROPOFOL 10 MG/ML IV BOLUS
INTRAVENOUS | Status: DC | PRN
  Administered 2018-02-15: 10 mg via INTRAVENOUS
  Administered 2018-02-15: 50 mg via INTRAVENOUS

## 2018-02-15 MED ORDER — LACTATED RINGERS IV SOLN
INTRAVENOUS | Status: DC | PRN
  Administered 2018-02-15: 11:00:00 via INTRAVENOUS

## 2018-02-15 MED ORDER — LIDOCAINE HCL (CARDIAC) PF 100 MG/5ML IV SOSY
PREFILLED_SYRINGE | INTRAVENOUS | Status: DC | PRN
  Administered 2018-02-15: 80 mg via INTRAVENOUS

## 2018-02-15 MED ORDER — LACTATED RINGERS IV SOLN: 10.0000 mL/h | INTRAVENOUS | Status: DC

## 2018-02-15 MED ORDER — GLYCOPYRROLATE 0.2 MG/ML IJ SOLN
INTRAMUSCULAR | Status: DC | PRN
  Administered 2018-02-15: 0.1 mg via INTRAVENOUS

## 2018-02-15 SURGICAL SUPPLY — 33 items
BALLN DILATOR 10-12 8 (BALLOONS)
BALLN DILATOR 12-15 8 (BALLOONS) ×4
BALLN DILATOR 15-18 8 (BALLOONS)
BALLN DILATOR CRE 0-12 8 (BALLOONS)
BALLN DILATOR ESOPH 8 10 CRE (MISCELLANEOUS) IMPLANT
BALLOON DILATOR 12-15 8 (BALLOONS) ×2 IMPLANT
BALLOON DILATOR 15-18 8 (BALLOONS) IMPLANT
BALLOON DILATOR CRE 0-12 8 (BALLOONS) IMPLANT
BLOCK BITE 60FR ADLT L/F GRN (MISCELLANEOUS) ×4 IMPLANT
CANISTER SUCT 1200ML W/VALVE (MISCELLANEOUS) ×4 IMPLANT
CLIP HMST 235XBRD CATH ROT (MISCELLANEOUS) IMPLANT
CLIP RESOLUTION 360 11X235 (MISCELLANEOUS)
ELECT REM PT RETURN 9FT ADLT (ELECTROSURGICAL)
ELECTRODE REM PT RTRN 9FT ADLT (ELECTROSURGICAL) IMPLANT
FCP ESCP3.2XJMB 240X2.8X (MISCELLANEOUS)
FORCEPS BIOP RAD 4 LRG CAP 4 (CUTTING FORCEPS) IMPLANT
FORCEPS BIOP RJ4 240 W/NDL (MISCELLANEOUS)
FORCEPS ESCP3.2XJMB 240X2.8X (MISCELLANEOUS) IMPLANT
GOWN CVR UNV OPN BCK APRN NK (MISCELLANEOUS) ×4 IMPLANT
GOWN ISOL THUMB LOOP REG UNIV (MISCELLANEOUS) ×4
INJECTOR VARIJECT VIN23 (MISCELLANEOUS) IMPLANT
KIT DEFENDO VALVE AND CONN (KITS) IMPLANT
KIT ENDO PROCEDURE OLY (KITS) ×4 IMPLANT
MARKER SPOT ENDO TATTOO 5ML (MISCELLANEOUS) IMPLANT
RETRIEVER NET PLAT FOOD (MISCELLANEOUS) IMPLANT
SNARE SHORT THROW 13M SML OVAL (MISCELLANEOUS) IMPLANT
SNARE SHORT THROW 30M LRG OVAL (MISCELLANEOUS) IMPLANT
SPOT EX ENDOSCOPIC TATTOO (MISCELLANEOUS)
SYR INFLATION 60ML (SYRINGE) ×4 IMPLANT
TRAP ETRAP POLY (MISCELLANEOUS) IMPLANT
VARIJECT INJECTOR VIN23 (MISCELLANEOUS)
WATER STERILE IRR 250ML POUR (IV SOLUTION) ×4 IMPLANT
WIRE CRE 18-20MM 8CM F G (MISCELLANEOUS) IMPLANT

## 2018-02-15 NOTE — Op Note (Signed)
[facility] Gastroenterology Patient Name: Carolyn PennerMary Lewis Procedure Date: 02/15/2018 10:36 AM MRN: [id number] Account #: [id number] Date of Birth: 05-22-1941 Admit Type: Outpatient Age: [age] Room[facility] 01 Gender: Female Note Status: Finalized Procedure:            Upper GI endoscopy Indications:          Dysphagia Providers:            Midge Miniumarren Nera Haworth MD, MD Referring MD:         Corky DownsJaved Masoud, MD (Referring MD) Medicines:            Propofol per Anesthesia Complications:        No immediate complications. Procedure:            Pre-Anesthesia Assessment:                       - Prior to the procedure, a History and Physical was                        performed, and patient medications and allergies were                        reviewed. The patient's tolerance of previous                        anesthesia was also reviewed. The risks and benefits of                        the procedure and the sedation options and risks were                        discussed with the patient. All questions were                        answered, and informed consent was obtained. Prior                        Anticoagulants: The patient has taken no previous                        anticoagulant or antiplatelet agents. ASA Grade                        Assessment: II - A patient with mild systemic disease.                        After reviewing the risks and benefits, the patient was                        deemed in satisfactory condition to undergo the                        procedure.                       After obtaining informed consent, the endoscope was                        passed under direct vision. Throughout the procedure,  the patient's blood pressure, pulse, and oxygen                        saturations were monitored continuously. The was                        introduced through the mouth, and advanced to the                        second part of  duodenum. The upper GI endoscopy was                        accomplished without difficulty. The patient tolerated                        the procedure well. Findings:      One benign-appearing, intrinsic moderate stenosis was found at the       gastroesophageal junction. The stenosis was traversed. A TTS dilator was       passed through the scope. Dilation with a 12-13.5-15 mm balloon dilator       was performed to 15 mm. The dilation site was examined following       endoscope reinsertion and showed moderate improvement in luminal       narrowing.      Localized moderate inflammation characterized by erythema was found in       the gastric antrum.      The examined duodenum was normal. Impression:           - Benign-appearing esophageal stenosis. Dilated.                       - Gastritis.                       - Normal examined duodenum.                       - No specimens collected. Recommendation:       - Discharge patient to home.                       - Resume previous diet.                       - Continue present medications.                       - Repeat upper endoscopy in 4 weeks for surveillance. Procedure Code(s):    --- Professional ---                       [phone], Esophagogastroduodenoscopy, flexible, transoral;                        with transendoscopic balloon dilation of esophagus                        (less than 30 mm diameter) Diagnosis Code(s):    --- Professional ---                       R13.10, Dysphagia, unspecified  K29.70, Gastritis, unspecified, without bleeding CPT copyright 2017 American Medical Association. All rights reserved. The codes documented in this report are preliminary and upon coder review may  be revised to meet current compliance requirements. [name] MD, MD 02/15/2018 10:54:42 AM This report has been signed electronically. Number of Addenda: 0 Note Initiated On: 02/15/2018 10:36 AM      [facility]

## 2018-02-15 NOTE — Anesthesia Procedure Notes (Signed)
Procedure Name: MAC Date/Time: 02/15/2018 10:41 AM Performed by: Lind Guest, CRNA Pre-anesthesia Checklist: Patient identified, Emergency Drugs available, Suction available, Patient being monitored and Timeout performed Patient Re-evaluated:Patient Re-evaluated prior to induction Oxygen Delivery Method: Nasal cannula

## 2018-02-15 NOTE — Anesthesia Postprocedure Evaluation (Signed)
Anesthesia Post Note  Patient: Carolyn Lewis  Procedure(s) Performed: ESOPHAGOGASTRODUODENOSCOPY (EGD) WITH PROPOFOL (N/A ) ESOPHAGEAL DILATION  Patient location during evaluation: PACU Anesthesia Type: General Level of consciousness: awake Pain management: pain level controlled Vital Signs Assessment: post-procedure vital signs reviewed and stable Respiratory status: respiratory function stable Cardiovascular status: stable Postop Assessment: no signs of nausea or vomiting Anesthetic complications: no    Jola BabinskiElsje Kasheem Toner

## 2018-02-15 NOTE — Anesthesia Preprocedure Evaluation (Addendum)
Anesthesia Evaluation  Patient identified by MRN, date of birth, ID band Patient awake    Reviewed: Allergy & Precautions, NPO status , Patient's Chart, lab work & pertinent test results  History of Anesthesia Complications Negative for: history of anesthetic complications  Airway Mallampati: III  TM Distance: >3 FB Neck ROM: Full    Dental  (+) Lower Dentures, Upper Dentures   Pulmonary COPD,  oxygen dependent, former smoker,  2l home O2    + wheezing      Cardiovascular hypertension, + Past MI (2011), + Cardiac Stents and + Peripheral Vascular Disease  Normal cardiovascular exam Rhythm:Regular Rate:Normal  2 Cardiac stents placed in 2011 2-D Echo in 2016, but I was unable to locate report.  AAA (less than 5.5cm)   Neuro/Psych PSYCHIATRIC DISORDERS Anxiety negative neurological ROS     GI/Hepatic GERD  ,Hx esophageal strictures   Endo/Other  negative endocrine ROS  Renal/GU CRFRenal disease (stage III CKD)     Musculoskeletal  (+) Arthritis ,   Abdominal   Peds  Hematology negative hematology ROS (+)   Anesthesia Other Findings   Reproductive/Obstetrics                             Anesthesia Physical  Anesthesia Plan  ASA: IV  Anesthesia Plan: General   Post-op Pain Management:    Induction: Intravenous  PONV Risk Score and Plan: 3 and TIVA  Airway Management Planned: Natural Airway  Additional Equipment:   Intra-op Plan:   Post-operative Plan:   Informed Consent: I have reviewed the patients History and Physical, chart, labs and discussed the procedure including the risks, benefits and alternatives for the proposed anesthesia with the patient or authorized representative who has indicated his/her understanding and acceptance.     Plan Discussed with: CRNA  Anesthesia Plan Comments:         Anesthesia Quick Evaluation

## 2018-02-15 NOTE — Transfer of Care (Signed)
Immediate Anesthesia Transfer of Care Note  Patient: Carolyn Lewis  Procedure(s) Performed: ESOPHAGOGASTRODUODENOSCOPY (EGD) WITH PROPOFOL (N/A ) ESOPHAGEAL DILATION  Patient Location: PACU  Anesthesia Type: General  Level of Consciousness: awake, alert  and patient cooperative  Airway and Oxygen Therapy: Patient Spontanous Breathing and Patient connected to supplemental oxygen  Post-op Assessment: Post-op Vital signs reviewed, Patient's Cardiovascular Status Stable, Respiratory Function Stable, Patent Airway and No signs of Nausea or vomiting  Post-op Vital Signs: Reviewed and stable  Complications: No apparent anesthesia complications

## 2018-02-15 NOTE — H&P (Signed)
[name], MD [facility] ([facility]) [address], [address] [place], [place] [address] Phone:[phone] Fax : [phone]  Primary Care Physician:  Corky Downs, MD Primary Gastroenterologist:  Dr. Servando Snare  Pre-Procedure History & Physical: HPI:  Carolyn Lewis is a 77 y.o. female is here for an endoscopy.   Past Medical History:  Diagnosis Date  . AAA (abdominal aortic aneurysm) (HCC)   . Acute MI [facility])    cath with 2 stents placed  . Anxiety   . Arthritis   . CKD stage G3b/A3, GFR 30 - 44 and albumin creatinine ratio >300 mg/g   . COPD (chronic obstructive pulmonary disease) (HCC)   . Dyspnea   . GERD (gastroesophageal reflux disease)   . Headache    migraines  . Hypertension   . Presence of stent in artery 01/2010   2 cardiac stents  . Wears dentures    full upper and lower    Past Surgical History:  Procedure Laterality Date  . ABDOMINAL HYSTERECTOMY    . CARDIAC CATHETERIZATION  2011  . ESOPHAGEAL DILATION  11/20/2016   Procedure: ESOPHAGEAL DILATION;  Surgeon: [name], MD;  Location: [facility];  Service: Endoscopy;;  . ESOPHAGEAL DILATION N/A Jun 11, 202019   Procedure: ESOPHAGEAL DILATION;  Surgeon: [name], MD;  Location: [facility];  Service: Endoscopy;  Laterality: N/A;  . ESOPHAGOGASTRODUODENOSCOPY (EGD) WITH PROPOFOL N/A 11/20/2016   Procedure: ESOPHAGOGASTRODUODENOSCOPY (EGD) WITH PROPOFOL;  Surgeon: [name], MD;  Location: [facility];  Service: Endoscopy;  Laterality: N/A;  . ESOPHAGOGASTRODUODENOSCOPY (EGD) WITH PROPOFOL N/A Jun 11, 202019   Procedure: ESOPHAGOGASTRODUODENOSCOPY (EGD) WITH PROPOFOL;  Surgeon: [name], MD;  Location: [facility];  Service: Endoscopy;  Laterality: N/A;    Prior to Admission medications   Medication Sig Start Date End Date Taking? Authorizing Provider  amitriptyline (ELAVIL) 25 MG tablet Take 25 mg by mouth at bedtime.   Yes [provider]  amLODipine (NORVASC) 5 MG tablet Take 5 mg by  mouth daily.   Yes [provider]  atorvastatin (LIPITOR) 40 MG tablet Take 40 mg by mouth daily.   Yes [provider]  clopidogrel (PLAVIX) 75 MG tablet Take 75 mg by mouth daily.   Yes [provider]  diazepam (VALIUM) 2 MG tablet Take 2 mg by mouth 2 (two) times daily.   Yes [provider]  enalapril (VASOTEC) 2.5 MG tablet Take 2.5 mg by mouth daily.   Yes [provider]  famotidine (PEPCID) 20 MG tablet Take 20 mg by mouth daily.   Yes [provider]  furosemide (LASIX) 20 MG tablet Take 20 mg by mouth daily. Patient only takes Monday, Wednesday, and Friday.    Yes [provider]  Ipratropium-Albuterol (COMBIVENT RESPIMAT) 20-100 MCG/ACT AERS respimat Inhale 1 puff into the lungs 3 (three) times daily as needed for wheezing or shortness of breath.   Yes [provider]  levalbuterol (XOPENEX) 1.25 MG/3ML nebulizer solution Take 1.25 mg by nebulization every 4 (four) hours as needed for wheezing or shortness of breath.   Yes [provider]  metoprolol (LOPRESSOR) 50 MG tablet Take 1 tablet (50 mg total) by mouth 2 (two) times daily. 02/03/15  Yes Mody, Patricia Pesa, MD  nitroGLYCERIN (NITROSTAT) 0.4 MG SL tablet Place 0.4 mg under the tongue every 5 (five) minutes as needed for chest pain.   Yes [provider]  pantoprazole (PROTONIX) 40 MG tablet Take 40 mg by mouth daily.   Yes [provider]  traMADol (ULTRAM) 50 MG tablet Take 50 mg by mouth 2 (two) times daily.   Yes [provider]    Allergies as of 02/08/2018 - Review Complete 02/08/2018  Allergen Reaction Noted  . Sulfa antibiotics Rash 01/31/2015    History reviewed. No pertinent family history.  Social History   Socioeconomic History  . Marital status: Married    Spouse name: Not on file  . Number of children: Not on file  . Years of education: Not on file  . Highest education level: Not on file  Occupational  History  . Not on file  Social Needs  . Financial resource strain: Not on file  . Food insecurity:    Worry: Not on file    Inability: Not on file  . Transportation needs:    Medical: Not on file    Non-medical: Not on file  Tobacco Use  . Smoking status: Former Smoker    Packs/day: 1.00    Years: 15.00    Pack years: 15.00    Types: Cigarettes    Last attempt to quit: 2015    Years since quitting: 4.4  . Smokeless tobacco: Never Used  Substance and Sexual Activity  . Alcohol use: No  . Drug use: No  . Sexual activity: Not Currently    Birth control/protection: Post-menopausal  Lifestyle  . Physical activity:    Days per week: Not on file    Minutes per session: Not on file  . Stress: Not on file  Relationships  . Social connections:    Talks on phone: Not on file    Gets together: Not on file    Attends religious service: Not on file    Active member of club or organization: Not on file    Attends meetings of clubs or organizations: Not on file    Relationship status: Not on file  . Intimate partner violence:    Fear of current or ex partner: Not on file    Emotionally abused: Not on file    Physically abused: Not on file    Forced sexual activity: Not on file  Other Topics Concern  . Not on file  Social History Narrative  . Not on file    Review of Systems: See HPI, otherwise negative ROS  Physical Exam: BP 134/61   Pulse 91   Temp 97.9 F (36.6 C) (Temporal)   Ht 5\' 6"  (1.676 m)   Wt 123 lb (55.8 kg)   SpO2 93%   BMI 19.85 kg/m  General:   Alert,  pleasant and cooperative in NAD Head:  Normocephalic and atraumatic. Neck:  Supple; no masses or thyromegaly. Lungs:  Clear throughout to auscultation.    Heart:  Regular rate and rhythm. Abdomen:  Soft, nontender and nondistended. Normal bowel sounds, without guarding, and without rebound.   Neurologic:  Alert and  oriented x4;  grossly normal neurologically.  Impression/Plan: [name] is here  for an endoscopy to be performed for esophageal stenosis  Risks, benefits, limitations, and alternatives regarding  endoscopy have been reviewed with the patient.  Questions have been answered.  All parties agreeable.   Midge Miniumarren Lazarus Sudbury, MD  02/15/2018, 10:25 AM

## 2018-02-24 ENCOUNTER — Telehealth: Payer: Self-pay | Admitting: Gastroenterology

## 2018-02-24 ENCOUNTER — Other Ambulatory Visit: Payer: Self-pay

## 2018-02-24 DIAGNOSIS — R1310 Dysphagia, unspecified: Secondary | ICD-10-CM

## 2018-02-24 NOTE — Telephone Encounter (Signed)
Patient daughter wanting to know if pt needs to keep ov for 7.11.19 or just schedule next egd based on results of last one on 6.24.19. Patient daughter resulting a call back.

## 2018-02-24 NOTE — Telephone Encounter (Signed)
Spoke with pt's daughter and scheduled follow up EGD with Wohl on 03/19/18. Cancelled office appt on 03/04/18.

## 2018-03-04 ENCOUNTER — Ambulatory Visit: Payer: Medicare Other | Admitting: Gastroenterology

## 2018-03-04 DIAGNOSIS — K222 Esophageal obstruction: Secondary | ICD-10-CM | POA: Diagnosis not present

## 2018-03-04 DIAGNOSIS — R627 Adult failure to thrive: Secondary | ICD-10-CM | POA: Diagnosis not present

## 2018-03-04 DIAGNOSIS — R29818 Other symptoms and signs involving the nervous system: Secondary | ICD-10-CM | POA: Diagnosis not present

## 2018-03-04 DIAGNOSIS — G43709 Chronic migraine without aura, not intractable, without status migrainosus: Secondary | ICD-10-CM | POA: Diagnosis not present

## 2018-03-10 ENCOUNTER — Other Ambulatory Visit: Payer: Self-pay

## 2018-03-10 ENCOUNTER — Encounter: Payer: Self-pay | Admitting: *Deleted

## 2018-03-16 DIAGNOSIS — J449 Chronic obstructive pulmonary disease, unspecified: Secondary | ICD-10-CM | POA: Diagnosis not present

## 2018-03-18 NOTE — Discharge Instructions (Signed)
General Anesthesia, Adult, Care After °These instructions provide you with information about caring for yourself after your procedure. Your health care provider may also give you more specific instructions. Your treatment has been planned according to current medical practices, but problems sometimes occur. Call your health care provider if you have any problems or questions after your procedure. °What can I expect after the procedure? °After the procedure, it is common to have: °· Vomiting. °· A sore throat. °· Mental slowness. ° °It is common to feel: °· Nauseous. °· Cold or shivery. °· Sleepy. °· Tired. °· Sore or achy, even in parts of your body where you did not have surgery. ° °Follow these instructions at home: °For at least 24 hours after the procedure: °· Do not: °? Participate in activities where you could fall or become injured. °? Drive. °? Use heavy machinery. °? Drink alcohol. °? Take sleeping pills or medicines that cause drowsiness. °? Make important decisions or sign legal documents. °? Take care of children on your own. °· Rest. °Eating and drinking °· If you vomit, drink water, juice, or soup when you can drink without vomiting. °· Drink enough fluid to keep your urine clear or pale yellow. °· Make sure you have little or no nausea before eating solid foods. °· Follow the diet recommended by your health care provider. °General instructions °· Have a responsible adult stay with you until you are awake and alert. °· Return to your normal activities as told by your health care provider. Ask your health care provider what activities are safe for you. °· Take over-the-counter and prescription medicines only as told by your health care provider. °· If you smoke, do not smoke without supervision. °· Keep all follow-up visits as told by your health care provider. This is important. °Contact a health care provider if: °· You continue to have nausea or vomiting at home, and medicines are not helpful. °· You  cannot drink fluids or start eating again. °· You cannot urinate after 8-12 hours. °· You develop a skin rash. °· You have fever. °· You have increasing redness at the site of your procedure. °Get help right away if: °· You have difficulty breathing. °· You have chest pain. °· You have unexpected bleeding. °· You feel that you are having a life-threatening or urgent problem. °This information is not intended to replace advice given to you by your health care provider. Make sure you discuss any questions you have with your health care provider. °Document Released: 11/17/2000 Document Revised: 01/14/2016 Document Reviewed: 07/26/2015 °Elsevier Interactive Patient Education © 2018 Elsevier Inc. ° °

## 2018-03-19 ENCOUNTER — Ambulatory Visit: Payer: Medicare Other | Admitting: Anesthesiology

## 2018-03-19 ENCOUNTER — Encounter: Payer: Self-pay | Admitting: Gastroenterology

## 2018-03-19 ENCOUNTER — Ambulatory Visit
Admission: RE | Admit: 2018-03-19 | Discharge: 2018-03-19 | Disposition: A | Discharge status: Home / Self Care | Payer: Medicare Other | Source: Ambulatory Visit | Attending: Gastroenterology | Admitting: Gastroenterology

## 2018-03-19 ENCOUNTER — Encounter
Admission: RE | Disposition: A | Discharge status: Home / Self Care | Payer: Self-pay | Source: Ambulatory Visit | Attending: Gastroenterology

## 2018-03-19 DIAGNOSIS — R1310 Dysphagia, unspecified: Secondary | ICD-10-CM | POA: Diagnosis not present

## 2018-03-19 DIAGNOSIS — K219 Gastro-esophageal reflux disease without esophagitis: Secondary | ICD-10-CM | POA: Insufficient documentation

## 2018-03-19 DIAGNOSIS — I129 Hypertensive chronic kidney disease with stage 1 through stage 4 chronic kidney disease, or unspecified chronic kidney disease: Secondary | ICD-10-CM | POA: Diagnosis not present

## 2018-03-19 DIAGNOSIS — I739 Peripheral vascular disease, unspecified: Secondary | ICD-10-CM | POA: Diagnosis not present

## 2018-03-19 DIAGNOSIS — J449 Chronic obstructive pulmonary disease, unspecified: Secondary | ICD-10-CM | POA: Diagnosis not present

## 2018-03-19 DIAGNOSIS — F419 Anxiety disorder, unspecified: Secondary | ICD-10-CM | POA: Insufficient documentation

## 2018-03-19 DIAGNOSIS — N183 Chronic kidney disease, stage 3 (moderate): Secondary | ICD-10-CM | POA: Diagnosis not present

## 2018-03-19 DIAGNOSIS — I252 Old myocardial infarction: Secondary | ICD-10-CM | POA: Diagnosis not present

## 2018-03-19 DIAGNOSIS — Z882 Allergy status to sulfonamides status: Secondary | ICD-10-CM | POA: Insufficient documentation

## 2018-03-19 DIAGNOSIS — K2970 Gastritis, unspecified, without bleeding: Secondary | ICD-10-CM | POA: Diagnosis not present

## 2018-03-19 DIAGNOSIS — Z955 Presence of coronary angioplasty implant and graft: Secondary | ICD-10-CM | POA: Insufficient documentation

## 2018-03-19 DIAGNOSIS — Z79899 Other long term (current) drug therapy: Secondary | ICD-10-CM | POA: Insufficient documentation

## 2018-03-19 DIAGNOSIS — Z7902 Long term (current) use of antithrombotics/antiplatelets: Secondary | ICD-10-CM | POA: Diagnosis not present

## 2018-03-19 DIAGNOSIS — I714 Abdominal aortic aneurysm, without rupture: Secondary | ICD-10-CM | POA: Diagnosis not present

## 2018-03-19 DIAGNOSIS — K222 Esophageal obstruction: Secondary | ICD-10-CM | POA: Diagnosis not present

## 2018-03-19 DIAGNOSIS — M1990 Unspecified osteoarthritis, unspecified site: Secondary | ICD-10-CM | POA: Insufficient documentation

## 2018-03-19 DIAGNOSIS — R1319 Other dysphagia: Secondary | ICD-10-CM | POA: Diagnosis not present

## 2018-03-19 DIAGNOSIS — Z87891 Personal history of nicotine dependence: Secondary | ICD-10-CM | POA: Diagnosis not present

## 2018-03-19 DIAGNOSIS — K2960 Other gastritis without bleeding: Secondary | ICD-10-CM | POA: Diagnosis not present

## 2018-03-19 HISTORY — PX: ESOPHAGOGASTRODUODENOSCOPY (EGD) WITH PROPOFOL: SHX5813

## 2018-03-19 HISTORY — PX: ESOPHAGEAL DILATION: SHX303

## 2018-03-19 SURGERY — ESOPHAGOGASTRODUODENOSCOPY (EGD) WITH PROPOFOL
Anesthesia: General | Site: Esophagus | Wound class: Clean Contaminated

## 2018-03-19 MED ORDER — FENTANYL CITRATE (PF) 100 MCG/2ML IJ SOLN: 25.0000 ug | INTRAMUSCULAR | Status: DC | PRN

## 2018-03-19 MED ORDER — OXYCODONE HCL 5 MG/5ML PO SOLN: 5.0000 mg | Freq: Once | ORAL | Status: DC | PRN

## 2018-03-19 MED ORDER — GLYCOPYRROLATE 0.2 MG/ML IJ SOLN
INTRAMUSCULAR | Status: DC | PRN
  Administered 2018-03-19: 0.1 mg via INTRAVENOUS

## 2018-03-19 MED ORDER — PANTOPRAZOLE SODIUM 40 MG PO TBEC: 40.0000 mg | DELAYED_RELEASE_TABLET | Freq: Two times a day (BID) | ORAL | 11 refills | Status: AC

## 2018-03-19 MED ORDER — STERILE WATER FOR IRRIGATION IR SOLN
Status: DC | PRN
  Administered 2018-03-19: 10:00:00

## 2018-03-19 MED ORDER — OXYCODONE HCL 5 MG PO TABS: 5.0000 mg | ORAL_TABLET | Freq: Once | ORAL | Status: DC | PRN

## 2018-03-19 MED ORDER — LIDOCAINE HCL (CARDIAC) PF 100 MG/5ML IV SOSY
PREFILLED_SYRINGE | INTRAVENOUS | Status: DC | PRN
  Administered 2018-03-19: 100 mg via INTRAVENOUS

## 2018-03-19 MED ORDER — PROPOFOL 10 MG/ML IV BOLUS
INTRAVENOUS | Status: DC | PRN
  Administered 2018-03-19: 70 mg via INTRAVENOUS

## 2018-03-19 MED ORDER — SODIUM CHLORIDE 0.9 % IV SOLN: INTRAVENOUS | Status: DC

## 2018-03-19 MED ORDER — LACTATED RINGERS IV SOLN
INTRAVENOUS | Status: DC
  Administered 2018-03-19: 10:00:00 via INTRAVENOUS

## 2018-03-19 SURGICAL SUPPLY — 33 items
BALLN DILATOR 10-12 8 (BALLOONS)
BALLN DILATOR 12-15 8 (BALLOONS) ×3
BALLN DILATOR 15-18 8 (BALLOONS)
BALLN DILATOR CRE 0-12 8 (BALLOONS)
BALLN DILATOR ESOPH 8 10 CRE (MISCELLANEOUS) IMPLANT
BALLOON DILATOR 12-15 8 (BALLOONS) ×1 IMPLANT
BALLOON DILATOR 15-18 8 (BALLOONS) IMPLANT
BALLOON DILATOR CRE 0-12 8 (BALLOONS) IMPLANT
BLOCK BITE 60FR ADLT L/F GRN (MISCELLANEOUS) ×3 IMPLANT
CANISTER SUCT 1200ML W/VALVE (MISCELLANEOUS) ×3 IMPLANT
CLIP HMST 235XBRD CATH ROT (MISCELLANEOUS) IMPLANT
CLIP RESOLUTION 360 11X235 (MISCELLANEOUS)
ELECT REM PT RETURN 9FT ADLT (ELECTROSURGICAL)
ELECTRODE REM PT RTRN 9FT ADLT (ELECTROSURGICAL) IMPLANT
FCP ESCP3.2XJMB 240X2.8X (MISCELLANEOUS)
FORCEPS BIOP RAD 4 LRG CAP 4 (CUTTING FORCEPS) IMPLANT
FORCEPS BIOP RJ4 240 W/NDL (MISCELLANEOUS)
FORCEPS ESCP3.2XJMB 240X2.8X (MISCELLANEOUS) IMPLANT
GOWN CVR UNV OPN BCK APRN NK (MISCELLANEOUS) ×2 IMPLANT
GOWN ISOL THUMB LOOP REG UNIV (MISCELLANEOUS) ×4
INJECTOR VARIJECT VIN23 (MISCELLANEOUS) IMPLANT
KIT DEFENDO VALVE AND CONN (KITS) IMPLANT
KIT ENDO PROCEDURE OLY (KITS) ×3 IMPLANT
MARKER SPOT ENDO TATTOO 5ML (MISCELLANEOUS) IMPLANT
RETRIEVER NET PLAT FOOD (MISCELLANEOUS) IMPLANT
SNARE SHORT THROW 13M SML OVAL (MISCELLANEOUS) IMPLANT
SNARE SHORT THROW 30M LRG OVAL (MISCELLANEOUS) IMPLANT
SPOT EX ENDOSCOPIC TATTOO (MISCELLANEOUS)
SYR INFLATION 60ML (SYRINGE) ×3 IMPLANT
TRAP ETRAP POLY (MISCELLANEOUS) IMPLANT
VARIJECT INJECTOR VIN23 (MISCELLANEOUS)
WATER STERILE IRR 250ML POUR (IV SOLUTION) ×3 IMPLANT
WIRE CRE 18-20MM 8CM F G (MISCELLANEOUS) IMPLANT

## 2018-03-19 NOTE — Transfer of Care (Signed)
Immediate Anesthesia Transfer of Care Note  Patient: Carolyn Lewis  Procedure(s) Performed: ESOPHAGOGASTRODUODENOSCOPY (EGD) WITH PROPOFOL (N/A Esophagus) ESOPHAGEAL DILATION (N/A Esophagus)  Patient Location: PACU  Anesthesia Type: General  Level of Consciousness: awake, alert  and patient cooperative  Airway and Oxygen Therapy: Patient Spontanous Breathing and Patient connected to supplemental oxygen  Post-op Assessment: Post-op Vital signs reviewed, Patient's Cardiovascular Status Stable, Respiratory Function Stable, Patent Airway and No signs of Nausea or vomiting  Post-op Vital Signs: Reviewed and stable  Complications: No apparent anesthesia complications

## 2018-03-19 NOTE — Anesthesia Postprocedure Evaluation (Signed)
Anesthesia Post Note  Patient: Carolyn Lewis  Procedure(s) Performed: ESOPHAGOGASTRODUODENOSCOPY (EGD) WITH PROPOFOL (N/A Esophagus) ESOPHAGEAL DILATION (N/A Esophagus)  Patient location during evaluation: PACU Anesthesia Type: General Level of consciousness: awake and alert Pain management: pain level controlled Vital Signs Assessment: post-procedure vital signs reviewed and stable Respiratory status: spontaneous breathing, nonlabored ventilation, respiratory function stable and patient connected to nasal cannula oxygen Cardiovascular status: blood pressure returned to baseline and stable Postop Assessment: no apparent nausea or vomiting Anesthetic complications: no    Alta CorningBacon, Brodin Gelpi S

## 2018-03-19 NOTE — Op Note (Signed)
[facility] Gastroenterology Patient Name: Carolyn Lewis Procedure Date: 03/19/2018 10:08 AM MRN: [id number] Account #: [id number] Date of Birth: [birth year] Admit Type: Outpatient Age: 77 Room: [facility] OR ROOM 01 Gender: Female Note Status: Finalized Procedure:            Upper GI endoscopy Indications:          Dysphagia Providers:            [name] MD, MD Referring MD:         Corky Downs, MD (Referring MD) Medicines:            Propofol per Anesthesia Complications:        No immediate complications. Procedure:            Pre-Anesthesia Assessment:                       - Prior to the procedure, a History and Physical was                        performed, and patient medications and allergies were                        reviewed. The patient's tolerance of previous                        anesthesia was also reviewed. The risks and benefits of                        the procedure and the sedation options and risks were                        discussed with the patient. All questions were                        answered, and informed consent was obtained. Prior                        Anticoagulants: The patient has taken no previous                        anticoagulant or antiplatelet agents. ASA Grade                        Assessment: III - A patient with severe systemic                        disease. After reviewing the risks and benefits, the                        patient was deemed in satisfactory condition to undergo                        the procedure.                       After obtaining informed consent, the endoscope was                        passed under direct vision. Throughout the procedure,  the patient's blood pressure, pulse, and oxygen                        saturations were monitored continuously. The was                        introduced through the mouth, and advanced to the                        second part of  duodenum. The upper GI endoscopy was                        accomplished without difficulty. The patient tolerated                        the procedure well. Findings:      One benign-appearing, intrinsic moderate stenosis was found at the       gastroesophageal junction. The stenosis was traversed. A TTS dilator was       passed through the scope. Dilation with a 12-13.5-15 mm balloon dilator       was performed to 15 mm. The dilation site was examined following       endoscope reinsertion and showed moderate improvement in luminal       narrowing.      Localized severe inflammation characterized by erosions was found in the       gastric antrum.      The examined duodenum was normal. Impression:           - Benign-appearing esophageal stenosis. Dilated.                       - Gastritis.                       - Normal examined duodenum.                       - No specimens collected. Recommendation:       - Discharge patient to home.                       - Resume previous diet.                       - Continue present medications. Procedure Code(s):    --- Professional ---                       [phone], Esophagogastroduodenoscopy, flexible, transoral;                        with transendoscopic balloon dilation of esophagus                        (less than 30 mm diameter) Diagnosis Code(s):    --- Professional ---                       R13.10, Dysphagia, unspecified                       K22.2, Esophageal obstruction  K29.70, Gastritis, unspecified, without bleeding CPT copyright 2017 American Medical Association. All rights reserved. The codes documented in this report are preliminary and upon coder review may  be revised to meet current compliance requirements. [name] MD, MD 03/19/2018 10:30:06 AM This report has been signed electronically. Number of Addenda: 0 Note Initiated On: 03/19/2018 10:08 AM      [facility]

## 2018-03-19 NOTE — Anesthesia Preprocedure Evaluation (Addendum)
Anesthesia Evaluation    Airway Mallampati: II  TM Distance: >3 FB Neck ROM: Full    Dental no notable dental hx.    Pulmonary COPD, former smoker,  Home O2.   Pulmonary exam normal  + wheezing      Cardiovascular hypertension, + Past MI and + Peripheral Vascular Disease  Normal cardiovascular exam Rhythm:Regular Rate:Normal     Neuro/Psych Anxiety    GI/Hepatic GERD  ,  Endo/Other    Renal/GU Renal disease     Musculoskeletal  (+) Arthritis ,   Abdominal   Peds  Hematology   Anesthesia Other Findings   Reproductive/Obstetrics                             Anesthesia Physical Anesthesia Plan  ASA: III  Anesthesia Plan: General   Post-op Pain Management:    Induction: Intravenous  PONV Risk Score and Plan:   Airway Management Planned: Natural Airway  Additional Equipment:   Intra-op Plan:   Post-operative Plan: Extubation in OR  Informed Consent: I have reviewed the patients History and Physical, chart, labs and discussed the procedure including the risks, benefits and alternatives for the proposed anesthesia with the patient or authorized representative who has indicated his/her understanding and acceptance.   Dental advisory given  Plan Discussed with: CRNA  Anesthesia Plan Comments: (Pt is in normal state of health.  Pt used inhalers preoperatively as instructed.)       Anesthesia Quick Evaluation

## 2018-03-19 NOTE — H&P (Signed)
Midge Miniumarren Kalvin Buss, MD [facility] [address] [place], [place] [address] Phone:[phone] Fax : [phone]  Primary Care Physician:  Corky DownsMasoud, Javed, MD Primary Gastroenterologist:  Dr. Servando SnareWohl  Pre-Procedure History & Physical: HPI:  Carolyn PonsMary B Lewis is a 77 y.o. female is here for an endoscopy.   Past Medical History:  Diagnosis Date  . AAA (abdominal aortic aneurysm) (HCC)   . Acute MI [facility](HCC)    cath with 2 stents placed  . Anxiety   . Arthritis   . CKD stage G3b/A3, GFR 30 - 44 and albumin creatinine ratio >300 mg/g   . COPD (chronic obstructive pulmonary disease) (HCC)   . Dyspnea   . GERD (gastroesophageal reflux disease)   . Headache    migraines  . Hypertension   . Presence of stent in artery 01/2010   2 cardiac stents  . Wears dentures    full upper and lower    Past Surgical History:  Procedure Laterality Date  . ABDOMINAL HYSTERECTOMY    . CARDIAC CATHETERIZATION  2011  . ESOPHAGEAL DILATION  11/20/2016   Procedure: ESOPHAGEAL DILATION;  Surgeon: Midge Miniumarren Mearl Harewood, MD;  [facility];  Service: Endoscopy;;  . ESOPHAGEAL DILATION N/A 01/21/2018   Procedure: ESOPHAGEAL DILATION;  Surgeon: Midge MiniumWohl, Yamila Cragin, MD;  [facility];  Service: Endoscopy;  Laterality: N/A;  . ESOPHAGEAL DILATION  02/15/2018   Procedure: ESOPHAGEAL DILATION;  Surgeon: Midge MiniumWohl, Irish Piech, MD;  [facility];  Service: Endoscopy;;  . ESOPHAGOGASTRODUODENOSCOPY (EGD) WITH PROPOFOL N/A 11/20/2016   Procedure: ESOPHAGOGASTRODUODENOSCOPY (EGD) WITH PROPOFOL;  Surgeon: Midge Miniumarren Eyoel Throgmorton, MD;  [facility];  Service: Endoscopy;  Laterality: N/A;  . ESOPHAGOGASTRODUODENOSCOPY (EGD) WITH PROPOFOL N/A 01/21/2018   Procedure: ESOPHAGOGASTRODUODENOSCOPY (EGD) WITH PROPOFOL;  Surgeon: Midge MiniumWohl, Lorenz Donley, MD;  [facility];  Service: Endoscopy;  Laterality: N/A;  . ESOPHAGOGASTRODUODENOSCOPY (EGD) WITH PROPOFOL N/A 02/15/2018   Procedure:  ESOPHAGOGASTRODUODENOSCOPY (EGD) WITH PROPOFOL;  Surgeon: Midge MiniumWohl, Markiyah Gahm, MD;  [facility];  Service: Endoscopy;  Laterality: N/A;    Prior to Admission medications   Medication Sig Start Date End Date Taking? Authorizing Provider  amitriptyline (ELAVIL) 25 MG tablet Take 25 mg by mouth at bedtime.   Yes [provider]  amLODipine (NORVASC) 5 MG tablet Take 5 mg by mouth daily.   Yes [provider]  atorvastatin (LIPITOR) 40 MG tablet Take 40 mg by mouth daily.   Yes [provider]  clopidogrel (PLAVIX) 75 MG tablet Take 75 mg by mouth daily.   Yes [provider]  diazepam (VALIUM) 2 MG tablet Take 2 mg by mouth 2 (two) times daily.   Yes [provider]  enalapril (VASOTEC) 2.5 MG tablet Take 2.5 mg by mouth daily.   Yes [provider]  famotidine (PEPCID) 20 MG tablet Take 20 mg by mouth daily.   Yes [provider]  furosemide (LASIX) 20 MG tablet Take 20 mg by mouth daily. Patient only takes Monday, Wednesday, and Friday.    Yes [provider]  Ipratropium-Albuterol (COMBIVENT RESPIMAT) 20-100 MCG/ACT AERS respimat Inhale 1 puff into the lungs 3 (three) times daily as needed for wheezing or shortness of breath.   Yes [provider]  levalbuterol (XOPENEX) 1.25 MG/3ML nebulizer solution Take 1.25 mg by nebulization every 4 (four) hours as needed for wheezing or shortness of breath.   Yes [provider]  metoprolol (LOPRESSOR) 50 MG tablet Take 1 tablet (50 mg total) by mouth 2 (  two) times daily. 02/03/15  Yes Mody, Patricia Pesa, MD  pantoprazole (PROTONIX) 40 MG tablet Take 40 mg by mouth daily.   Yes [provider]  traMADol (ULTRAM) 50 MG tablet Take 50 mg by mouth 2 (two) times daily.   Yes [provider]  nitroGLYCERIN (NITROSTAT) 0.4 MG SL tablet Place 0.4 mg under the tongue every 5 (five) minutes as needed for chest pain.    [provider]    Allergies  as of 02/24/2018 - Review Complete 02/15/2018  Allergen Reaction Noted  . Sulfa antibiotics Rash 01/31/2015    History reviewed. No pertinent family history.  Social History   Socioeconomic History  . Marital status: Married    Spouse name: Not on file  . Number of children: Not on file  . Years of education: Not on file  . Highest education level: Not on file  Occupational History  . Not on file  Social Needs  . Financial resource strain: Not on file  . Food insecurity:    Worry: Not on file    Inability: Not on file  . Transportation needs:    Medical: Not on file    Non-medical: Not on file  Tobacco Use  . Smoking status: Former Smoker    Packs/day: 1.00    Years: 15.00    Pack years: 15.00    Types: Cigarettes    Last attempt to quit: 2015    Years since quitting: 4.5  . Smokeless tobacco: Never Used  Substance and Sexual Activity  . Alcohol use: No  . Drug use: No  . Sexual activity: Not Currently    Birth control/protection: Post-menopausal  Lifestyle  . Physical activity:    Days per week: Not on file    Minutes per session: Not on file  . Stress: Not on file  Relationships  . Social connections:    Talks on phone: Not on file    Gets together: Not on file    Attends religious service: Not on file    Active member of club or organization: Not on file    Attends meetings of clubs or organizations: Not on file    Relationship status: Not on file  . Intimate partner violence:    Fear of current or ex partner: Not on file    Emotionally abused: Not on file    Physically abused: Not on file    Forced sexual activity: Not on file  Other Topics Concern  . Not on file  Social History Narrative  . Not on file    Review of Systems: See HPI, otherwise negative ROS  Physical Exam: Ht 5\' 6"  (1.676 m)   Wt 123 lb (55.8 kg)   BMI 19.85 kg/m  General:   Alert,  pleasant and cooperative in NAD Head:  Normocephalic and atraumatic. Neck:  Supple; no masses  or thyromegaly. Lungs:  Clear throughout to auscultation.    Heart:  Regular rate and rhythm. Abdomen:  Soft, nontender and nondistended. Normal bowel sounds, without guarding, and without rebound.   Neurologic:  Alert and  oriented x4;  grossly normal neurologically.  Impression/Plan: Carolyn Pons is here for an endoscopy to be performed for dysphagia  Risks, benefits, limitations, and alternatives regarding  endoscopy have been reviewed with the patient.  Questions have been answered.  All parties agreeable.   [name], MD  03/19/2018, 9:32 AM

## 2018-04-16 DIAGNOSIS — J449 Chronic obstructive pulmonary disease, unspecified: Secondary | ICD-10-CM | POA: Diagnosis not present

## 2018-04-29 DIAGNOSIS — I10 Essential (primary) hypertension: Secondary | ICD-10-CM | POA: Diagnosis not present

## 2018-04-29 DIAGNOSIS — R296 Repeated falls: Secondary | ICD-10-CM | POA: Diagnosis not present

## 2018-04-29 DIAGNOSIS — M792 Neuralgia and neuritis, unspecified: Secondary | ICD-10-CM | POA: Diagnosis not present

## 2018-04-29 DIAGNOSIS — J449 Chronic obstructive pulmonary disease, unspecified: Secondary | ICD-10-CM | POA: Diagnosis not present

## 2018-05-17 DIAGNOSIS — J449 Chronic obstructive pulmonary disease, unspecified: Secondary | ICD-10-CM | POA: Diagnosis not present

## 2018-06-16 DIAGNOSIS — J449 Chronic obstructive pulmonary disease, unspecified: Secondary | ICD-10-CM | POA: Diagnosis not present

## 2018-06-25 DIAGNOSIS — I10 Essential (primary) hypertension: Secondary | ICD-10-CM | POA: Diagnosis not present

## 2018-06-25 DIAGNOSIS — J449 Chronic obstructive pulmonary disease, unspecified: Secondary | ICD-10-CM | POA: Diagnosis not present

## 2018-06-25 DIAGNOSIS — M792 Neuralgia and neuritis, unspecified: Secondary | ICD-10-CM | POA: Diagnosis not present

## 2018-06-25 DIAGNOSIS — R062 Wheezing: Secondary | ICD-10-CM | POA: Diagnosis not present

## 2018-07-17 DIAGNOSIS — J449 Chronic obstructive pulmonary disease, unspecified: Secondary | ICD-10-CM | POA: Diagnosis not present

## 2018-08-06 ENCOUNTER — Emergency Department: Payer: Medicare Other

## 2018-08-06 ENCOUNTER — Other Ambulatory Visit: Payer: Self-pay

## 2018-08-06 ENCOUNTER — Emergency Department
Admission: EM | Admit: 2018-08-06 | Discharge: 2018-08-06 | Disposition: A | Discharge status: Home / Self Care | Payer: Medicare Other | Attending: Emergency Medicine | Admitting: Emergency Medicine

## 2018-08-06 DIAGNOSIS — I129 Hypertensive chronic kidney disease with stage 1 through stage 4 chronic kidney disease, or unspecified chronic kidney disease: Secondary | ICD-10-CM | POA: Diagnosis not present

## 2018-08-06 DIAGNOSIS — R202 Paresthesia of skin: Secondary | ICD-10-CM | POA: Insufficient documentation

## 2018-08-06 DIAGNOSIS — J441 Chronic obstructive pulmonary disease with (acute) exacerbation: Secondary | ICD-10-CM | POA: Diagnosis not present

## 2018-08-06 DIAGNOSIS — Z79899 Other long term (current) drug therapy: Secondary | ICD-10-CM | POA: Diagnosis not present

## 2018-08-06 DIAGNOSIS — Z955 Presence of coronary angioplasty implant and graft: Secondary | ICD-10-CM | POA: Insufficient documentation

## 2018-08-06 DIAGNOSIS — M7989 Other specified soft tissue disorders: Secondary | ICD-10-CM | POA: Diagnosis not present

## 2018-08-06 DIAGNOSIS — G43709 Chronic migraine without aura, not intractable, without status migrainosus: Secondary | ICD-10-CM | POA: Diagnosis not present

## 2018-08-06 DIAGNOSIS — I998 Other disorder of circulatory system: Secondary | ICD-10-CM | POA: Insufficient documentation

## 2018-08-06 DIAGNOSIS — Z7902 Long term (current) use of antithrombotics/antiplatelets: Secondary | ICD-10-CM | POA: Diagnosis not present

## 2018-08-06 DIAGNOSIS — Z87891 Personal history of nicotine dependence: Secondary | ICD-10-CM | POA: Diagnosis not present

## 2018-08-06 DIAGNOSIS — S9031XA Contusion of right foot, initial encounter: Secondary | ICD-10-CM | POA: Diagnosis not present

## 2018-08-06 DIAGNOSIS — R05 Cough: Secondary | ICD-10-CM | POA: Insufficient documentation

## 2018-08-06 DIAGNOSIS — J439 Emphysema, unspecified: Secondary | ICD-10-CM | POA: Diagnosis not present

## 2018-08-06 DIAGNOSIS — R627 Adult failure to thrive: Secondary | ICD-10-CM | POA: Diagnosis not present

## 2018-08-06 DIAGNOSIS — N183 Chronic kidney disease, stage 3 (moderate): Secondary | ICD-10-CM | POA: Diagnosis not present

## 2018-08-06 DIAGNOSIS — R29818 Other symptoms and signs involving the nervous system: Secondary | ICD-10-CM | POA: Diagnosis not present

## 2018-08-06 DIAGNOSIS — R2241 Localized swelling, mass and lump, right lower limb: Secondary | ICD-10-CM | POA: Diagnosis present

## 2018-08-06 DIAGNOSIS — L97501 Non-pressure chronic ulcer of other part of unspecified foot limited to breakdown of skin: Secondary | ICD-10-CM | POA: Diagnosis not present

## 2018-08-06 LAB — COMPREHENSIVE METABOLIC PANEL
ALBUMIN: 4.6 g/dL (ref 3.5–5.0)
ALT: 21 U/L (ref 0–44)
ANION GAP: 12 (ref 5–15)
AST: 28 U/L (ref 15–41)
Alkaline Phosphatase: 103 U/L (ref 38–126)
BUN: 35 mg/dL — ABNORMAL HIGH (ref 8–23)
CO2: 26 mmol/L (ref 22–32)
Calcium: 9.3 mg/dL (ref 8.9–10.3)
Chloride: 97 mmol/L — ABNORMAL LOW (ref 98–111)
Creatinine, Ser: 1.45 mg/dL — ABNORMAL HIGH (ref 0.44–1.00)
GFR calc Af Amer: 40 mL/min — ABNORMAL LOW (ref >60)
GFR calc non Af Amer: 35 mL/min — ABNORMAL LOW (ref >60)
GLUCOSE: 92 mg/dL (ref 70–99)
POTASSIUM: 4.4 mmol/L (ref 3.5–5.1)
SODIUM: 135 mmol/L (ref 135–145)
Total Bilirubin: 0.4 mg/dL (ref 0.3–1.2)
Total Protein: 8.4 g/dL — ABNORMAL HIGH (ref 6.5–8.1)

## 2018-08-06 LAB — CBC
HCT: 41.7 % (ref 36.0–46.0)
Hemoglobin: 13 g/dL (ref 12.0–15.0)
MCH: 29.5 pg (ref 26.0–34.0)
MCHC: 31.2 g/dL (ref 30.0–36.0)
MCV: 94.6 fL (ref 80.0–100.0)
Platelets: 402 10*3/uL — ABNORMAL HIGH (ref 150–400)
RBC: 4.41 MIL/uL (ref 3.87–5.11)
RDW: 14 % (ref 11.5–15.5)
WBC: 14.8 10*3/uL — AB (ref 4.0–10.5)
nRBC: 0 % (ref 0.0–0.2)

## 2018-08-06 MED ORDER — IPRATROPIUM-ALBUTEROL 0.5-2.5 (3) MG/3ML IN SOLN: 3.0000 mL | Freq: Once | RESPIRATORY_TRACT | Status: DC

## 2018-08-06 MED ORDER — ASPIRIN 81 MG PO CHEW
324.0000 mg | CHEWABLE_TABLET | Freq: Once | ORAL | Status: AC
  Administered 2018-08-06: 324 mg via ORAL
  Filled 2018-08-06: qty 4

## 2018-08-06 MED ORDER — PREDNISONE 20 MG PO TABS: 40.0000 mg | ORAL_TABLET | Freq: Every day | ORAL | 0 refills | Status: AC

## 2018-08-06 MED ORDER — PREDNISONE 20 MG PO TABS
40.0000 mg | ORAL_TABLET | Freq: Once | ORAL | Status: AC
  Administered 2018-08-06: 40 mg via ORAL
  Filled 2018-08-06: qty 2

## 2018-08-06 MED ORDER — ASPIRIN EC 81 MG PO TBEC: 81.0000 mg | DELAYED_RELEASE_TABLET | Freq: Every day | ORAL | 0 refills | Status: AC

## 2018-08-06 MED ORDER — AZITHROMYCIN 500 MG PO TABS
500.0000 mg | ORAL_TABLET | Freq: Once | ORAL | Status: AC
  Administered 2018-08-06: 500 mg via ORAL
  Filled 2018-08-06: qty 1

## 2018-08-06 MED ORDER — AZITHROMYCIN 250 MG PO TABS: ORAL_TABLET | ORAL | 0 refills | Status: DC

## 2018-08-06 NOTE — ED Provider Notes (Signed)
[facility] Emergency Department Provider Note ____________________________________________   First MD Initiated Contact with Patient 08/06/18 [phone]     (approximate)  I have reviewed the triage vital signs and the nursing notes.  HISTORY  Chief Complaint Foot Swelling  HPI Carolyn Lewis is a 77 y.o. female presents for evaluation from her primary care office  Patient reports she was sent here for concerns of blue discoloration of her right foot.  She reports is been ongoing for about 2 weeks now, she does have a history of vascular disease and takes Plavix for this daily.  She is followed with Dr. Gilda Crease.  The blueness has been slowly progressing for about 2 weeks, fairly unchanged for the last few days and has a slight tingling feeling associated but no severe pain.  No loss of sensation  The blueness is over the top of the right foot, on the lower portion of it.  Carolyn Lewis reports she was originally a primary care doctor's office.  She is been having a cough for about a week, has COPD and felt that she needed to get on prednisone antibiotics to get rid of it.  She uses her inhaler and it helps a lot, but reports that in the past she is traditionally needed antibiotics and prednisone.  Denies shortness of breath at present.  Does occasionally have a dry cough.  No chest pain.  No abdominal pain.  No fevers or chills.   Past Medical History:  Diagnosis Date  . AAA (abdominal aortic aneurysm) (HCC)   . Acute MI [facility])    cath with 2 stents placed  . Anxiety   . Arthritis   . CKD stage G3b/A3, GFR 30 - 44 and albumin creatinine ratio >300 mg/g   . COPD (chronic obstructive pulmonary disease) (HCC)   . Dyspnea   . GERD (gastroesophageal reflux disease)   . Headache    migraines  . Hypertension   . Presence of stent in artery 01/2010   2 cardiac stents  . Wears dentures    full upper and lower    Patient Active Problem List   Diagnosis Date Noted  .  Stricture esophagus   . Dysphagia   . Problems with swallowing and mastication   . Stricture and stenosis of esophagus   . Gastritis without bleeding   . COPD exacerbation (HCC) 06/16/2016  . Anxiety 06/16/2016  . GERD (gastroesophageal reflux disease) 06/16/2016  . HTN (hypertension) 06/16/2016  . CKD (chronic kidney disease), stage III (HCC) 06/16/2016  . Fever 02/06/2015    Past Surgical History:  Procedure Laterality Date  . ABDOMINAL HYSTERECTOMY    . CARDIAC CATHETERIZATION  2011  . ESOPHAGEAL DILATION  11/20/2016   Procedure: ESOPHAGEAL DILATION;  Surgeon: [name], MD;  Location: [facility];  Service: Endoscopy;;  . ESOPHAGEAL DILATION N/A Apr 10, 202019   Procedure: ESOPHAGEAL DILATION;  Surgeon: [name], MD;  Location: [facility];  Service: Endoscopy;  Laterality: N/A;  . ESOPHAGEAL DILATION  02/15/2018   Procedure: ESOPHAGEAL DILATION;  Surgeon: [name], MD;  Location: [facility];  Service: Endoscopy;;  . ESOPHAGEAL DILATION N/A 03/19/2018   Procedure: ESOPHAGEAL DILATION;  Surgeon: [name], MD;  Location: [facility];  Service: Endoscopy;  Laterality: N/A;  . ESOPHAGOGASTRODUODENOSCOPY (EGD) WITH PROPOFOL N/A 11/20/2016   Procedure: ESOPHAGOGASTRODUODENOSCOPY (EGD) WITH PROPOFOL;  Surgeon: [name], MD;  Location: [facility];  Service: Endoscopy;  Laterality: N/A;  . ESOPHAGOGASTRODUODENOSCOPY (EGD) WITH PROPOFOL  N/A 04-04-202019   Procedure: ESOPHAGOGASTRODUODENOSCOPY (EGD) WITH PROPOFOL;  Surgeon: [name], MD;  Location: [facility];  Service: Endoscopy;  Laterality: N/A;  . ESOPHAGOGASTRODUODENOSCOPY (EGD) WITH PROPOFOL N/A 02/15/2018   Procedure: ESOPHAGOGASTRODUODENOSCOPY (EGD) WITH PROPOFOL;  Surgeon: [name], MD;  Location: [facility];  Service: Endoscopy;  Laterality: N/A;  . ESOPHAGOGASTRODUODENOSCOPY (EGD) WITH PROPOFOL N/A 03/19/2018   Procedure: ESOPHAGOGASTRODUODENOSCOPY (EGD) WITH  PROPOFOL;  Surgeon: [name], MD;  Location: [facility];  Service: Endoscopy;  Laterality: N/A;    Prior to Admission medications   Medication Sig Start Date End Date Taking? Authorizing Provider  amitriptyline (ELAVIL) 25 MG tablet Take 25 mg by mouth at bedtime.    [provider]  amLODipine (NORVASC) 5 MG tablet Take 5 mg by mouth daily.    [provider]  aspirin EC 81 MG tablet Take 1 tablet (81 mg total) by mouth daily. 08/06/18 08/06/19  Sharyn Creamer, MD  atorvastatin (LIPITOR) 40 MG tablet Take 40 mg by mouth daily.    [provider]  azithromycin (ZITHROMAX Z-PAK) 250 MG tablet 1 tab by mouth daily 08/06/18   Sharyn Creamer, MD  clopidogrel (PLAVIX) 75 MG tablet Take 75 mg by mouth daily.    [provider]  diazepam (VALIUM) 2 MG tablet Take 2 mg by mouth 2 (two) times daily.    [provider]  enalapril (VASOTEC) 2.5 MG tablet Take 2.5 mg by mouth daily.    [provider]  famotidine (PEPCID) 20 MG tablet Take 20 mg by mouth daily.    [provider]  furosemide (LASIX) 20 MG tablet Take 20 mg by mouth daily. Patient only takes Monday, Wednesday, and Friday.     [provider]  Ipratropium-Albuterol (COMBIVENT RESPIMAT) 20-100 MCG/ACT AERS respimat Inhale 1 puff into the lungs 3 (three) times daily as needed for wheezing or shortness of breath.    [provider]  levalbuterol Pauline Aus) 1.25 MG/3ML nebulizer solution Take 1.25 mg by nebulization every 4 (four) hours as needed for wheezing or shortness of breath.    [provider]  metoprolol (LOPRESSOR) 50 MG tablet Take 1 tablet (50 mg total) by mouth 2 (two) times daily. 02/03/15   Adrian Saran, MD  nitroGLYCERIN (NITROSTAT) 0.4 MG SL tablet Place 0.4 mg under the tongue every 5 (five) minutes as needed for chest pain.    [provider]  pantoprazole (PROTONIX) 40 MG tablet Take 1 tablet (40 mg total) by mouth 2 (two)  times daily. 03/19/18   [name], MD  predniSONE (DELTASONE) 20 MG tablet Take 2 tablets (40 mg total) by mouth daily. 08/06/18   Sharyn Creamer, MD  traMADol (ULTRAM) 50 MG tablet Take 50 mg by mouth 2 (two) times daily.    [provider]    Allergies Sulfa antibiotics  No family history on file.  Social History Social History   Tobacco Use  . Smoking status: Former Smoker    Packs/day: 1.00    Years: 15.00    Pack years: 15.00    Types: Cigarettes    Last attempt to quit: 2015    Years since quitting: 4.9  . Smokeless tobacco: Never Used  Substance Use Topics  . Alcohol use: No  . Drug use: No    Review of Systems Constitutional: No fever/chills slight fatigue Eyes: No visual changes. ENT: No sore throat. Cardiovascular: Denies chest pain. Respiratory: Denies shortness of breath.  Occasional cough and wheezing for the  last week.  Does not think it is worsened at all, reports she just basically think she needs some steroids. Gastrointestinal: No abdominal pain.   Genitourinary: Negative for dysuria. Musculoskeletal: Negative for back pain.  See HPI regarding right foot.  Left foot no problems. Skin: Negative for rash. Neurological: Negative for headaches, areas of focal weakness or numbness.    ____________________________________________   PHYSICAL EXAM:  VITAL SIGNS: ED Triage Vitals  Enc Vitals Group     BP 08/06/18 1605 122/85     Pulse Rate 08/06/18 1605 (!) 103     Resp --      Temp 08/06/18 1605 98.1 F (36.7 C)     Temp Source 08/06/18 1605 Oral     SpO2 08/06/18 1605 95 %     Weight 08/06/18 1606 123 lb (55.8 kg)     Height 08/06/18 1606 5\' 4"  (1.626 m)     Head Circumference --      Peak Flow --      Pain Score 08/06/18 1623 8     Pain Loc --      Pain Edu? --      Excl. in GC? --     Constitutional: Alert and oriented. Well appearing and in no acute distress. Eyes: Conjunctivae are normal. Head: Atraumatic. Nose: No  congestion/rhinnorhea. Mouth/Throat: Mucous membranes are moist. Neck: No stridor.  Cardiovascular: Normal rate, regular rhythm. Grossly normal heart sounds.  Good peripheral circulation. Respiratory: Normal respiratory effort.  No retractions. Lungs CTAB. Gastrointestinal: Soft and nontender. No distention. Musculoskeletal:   Lower Extremities  No edema. Normal DP/PT pulses bilateral with good cap refill.  Normal neuro-motor function lower extremities bilateral.  RIGHT Right lower extremity demonstrates normal strength, good use of all muscles. No edema bruising or contusions of the right hip, right knee, right ankle except for small amount of bruising over the right shin which she reports frequently appears due to bumping into things without overlying laceration. Full range of motion of the right lower extremity without pain but she does report a slight tingly feeling over the right foot and right toes.  The toes the right foot have a slight duskiness over the dorsal surface of the midfoot down.  The dorsalis pedis and posterior tibial pulses are clearly dopplerable.  Capillary refill is about 2 to 3 seconds in the right lower foot with some bluish and duskiness. No pain on axial loading. No evidence of trauma.  LEFT Left lower extremity demonstrates normal strength, good use of all muscles. No edema bruising or contusions of the hip,  knee, ankle. Full range of motion of the left lower extremity without pain. No pain on axial loading. No evidence of trauma.   Neurologic:  Normal speech and language. No gross focal neurologic deficits are appreciated.  Skin:  Skin is warm, dry and intact. No rash noted. Psychiatric: Mood and affect are normal. Speech and behavior are normal.  ____________________________________________   LABS (all labs ordered are listed, but only abnormal results are displayed)  Labs Reviewed  CBC - Abnormal; Notable for the following components:      Result Value    WBC 14.8 (*)    Platelets 402 (*)    All other components within normal limits  COMPREHENSIVE METABOLIC PANEL - Abnormal; Notable for the following components:   Chloride 97 (*)    BUN 35 (*)    Creatinine, Ser 1.45 (*)    Total Protein 8.4 (*)    GFR calc non  Af Amer 35 (*)    GFR calc Af Amer 40 (*)    All other components within normal limits   ____________________________________________  EKG   ____________________________________________  RADIOLOGY  Dg Chest 2 View  Result Date: 08/06/2018 CLINICAL DATA:  Bilateral lower extremity swelling. EXAM: CHEST - 2 VIEW COMPARISON:  Single-view of the chest 06/16/2016. PA and lateral chest 03/23/2015. FINDINGS: The chest is hyperexpanded with attenuation of the pulmonary vasculature. The lungs are clear. No pneumothorax or pleural fluid. Heart size is normal. Atherosclerosis is noted. No acute or focal bony abnormality. Mild convex left thoracic scoliosis is seen. IMPRESSION: Emphysema without acute disease. Electronically Signed   By: Drusilla Kanner M.D.   On: 08/06/2018 20:24   US Venous Img Lower Bilateral  Result Date: 08/06/2018 CLINICAL DATA:  BILATERAL lower extremity swelling with bruising to RIGHT foot EXAM: BILATERAL LOWER EXTREMITY VENOUS DOPPLER ULTRASOUND TECHNIQUE: Gray-scale sonography with graded compression, as well as color Doppler and duplex ultrasound were performed to evaluate the lower extremity deep venous systems from the level of the common femoral vein and including the common femoral, femoral, profunda femoral, popliteal and calf veins including the posterior tibial, peroneal and gastrocnemius veins when visible. The superficial great saphenous vein was also interrogated. Spectral Doppler was utilized to evaluate flow at rest and with distal augmentation maneuvers in the common femoral, femoral and popliteal veins. COMPARISON:  02/11/2010 FINDINGS: RIGHT LOWER EXTREMITY Common Femoral Vein: No evidence of  thrombus. Normal compressibility, respiratory phasicity and response to augmentation. Saphenofemoral Junction: No evidence of thrombus. Normal compressibility and flow on color Doppler imaging. Profunda Femoral Vein: No evidence of thrombus. Normal compressibility and flow on color Doppler imaging. Femoral Vein: No evidence of thrombus. Normal compressibility, respiratory phasicity and response to augmentation. Popliteal Vein: No evidence of thrombus. Normal compressibility, respiratory phasicity and response to augmentation. Calf Veins: Inadequately evaluated due to calf edema. Superficial Great Saphenous Vein: No evidence of thrombus. Normal compressibility. Venous Reflux:  None. Other Findings:  None. LEFT LOWER EXTREMITY Common Femoral Vein: No evidence of thrombus. Normal compressibility, respiratory phasicity and response to augmentation. Saphenofemoral Junction: No evidence of thrombus. Normal compressibility and flow on color Doppler imaging. Profunda Femoral Vein: No evidence of thrombus. Normal compressibility and flow on color Doppler imaging. Femoral Vein: No evidence of thrombus. Normal compressibility, respiratory phasicity and response to augmentation. Popliteal Vein: No evidence of thrombus. Normal compressibility, respiratory phasicity and response to augmentation. Calf Veins: Inadequately evaluated due to calf edema. Superficial Great Saphenous Vein: No evidence of thrombus. Normal compressibility. Venous Reflux:  None. Other Findings:  None. IMPRESSION: No evidence of above knee deep venous thrombosis in either lower extremity. Nondiagnostic assessment of deep veins in the calves bilaterally. Electronically Signed   By: Ulyses Southward M.D.   On: 08/06/2018 17:45   Dg Foot Complete Right  Result Date: 08/06/2018 CLINICAL DATA:  Right foot bruising EXAM: RIGHT FOOT COMPLETE - 3+ VIEW COMPARISON:  None. FINDINGS: Mild osteopenia. No acute fracture or dislocation. No erosion or periosteal reaction.  Normal soft tissues. IMPRESSION: No acute osseous injury of the right foot. Electronically Signed   By: Deatra Robinson M.D.   On: 08/06/2018 20:25    X-ray right foot as well as chest unremarkable with regard to acute.  No DVTs on ultrasound ____________________________________________   PROCEDURES  Procedure(s) performed: None  Procedures  Critical Care performed: No  ____________________________________________   INITIAL IMPRESSION / ASSESSMENT AND PLAN / ED COURSE  Pertinent labs & imaging  results that were available during my care of the patient were reviewed by me and considered in my medical decision making (see chart for details).   Patient presents reports she apparently went to her doctor to be evaluated and received prednisone prescription for bronchitis, but also mention her right foot at the time.  She does not have evidence of a clear critical ischemia as she has intact dorsalis and posterior tibial pulses, however I am concerned about the fact that she does have some distal ischemia involving the right foot but does still have some cap refill intact she is not having severe pain.  I do believe and see no signs of any necrosis, but she is likely had some sort of a small thromboembolic event would be my guess given her known history of peripheral vascular disease.  Discussed her case and care with vascular surgeon, Dr. Festus BarrenJason Dew, the patient is followed by the clinic.  He advised placing the patient on a second antiplatelet agents which we will choose aspirin for, patient in agreement.  She will also follow-up with her clinic on Monday, she is agreeable with this plan.  Vascular surgeon advises that the patient does not meet criteria for admission or need for heparin at this time, and I would agree she does not have any severe pain but has some very mild symptoms and some distal ischemia but is appropriate for close outpatient follow-up symptoms have been present for about 2 weeks  time now  Patient agreement.  With regard to her respiratory status, lungs are clear and respirations even and unlabored.  Normal oxygen saturation.  Does report ongoing cough, recent bronchitis symptoms.  Will prescribe her azithromycin and prednisone as well, has albuterol at home.  Patient agreement.  We will follow-up with Dr. Harl BowieMassoud.  Return precautions and treatment recommendations and follow-up discussed with the patient who is agreeable with the plan.       ____________________________________________   FINAL CLINICAL IMPRESSION(S) / ED DIAGNOSES  Final diagnoses:  COPD exacerbation (HCC)  Ischemia of foot        Note:  This document was prepared using Dragon voice recognition software and may include unintentional dictation errors       Sharyn CreamerQuale, Tahara Ruffini, MD 08/06/18 2117

## 2018-08-06 NOTE — ED Triage Notes (Signed)
[facility] with concern of no blood flow to the rt foot, pt has swelling to bilat feet with bruising with worsening to the rt foot, bilat pedal pulses noted with the vascular doppler in triage.

## 2018-08-06 NOTE — Discharge Instructions (Signed)
Please also start taking a baby aspirin daily (81mg )

## 2018-08-09 ENCOUNTER — Encounter (INDEPENDENT_AMBULATORY_CARE_PROVIDER_SITE_OTHER): Payer: Self-pay

## 2018-08-10 ENCOUNTER — Encounter (INDEPENDENT_AMBULATORY_CARE_PROVIDER_SITE_OTHER): Payer: Self-pay | Admitting: *Deleted

## 2018-08-10 ENCOUNTER — Ambulatory Visit (INDEPENDENT_AMBULATORY_CARE_PROVIDER_SITE_OTHER): Payer: Medicare Other | Admitting: Vascular Surgery

## 2018-08-10 ENCOUNTER — Ambulatory Visit (INDEPENDENT_AMBULATORY_CARE_PROVIDER_SITE_OTHER): Payer: Medicare Other

## 2018-08-10 ENCOUNTER — Encounter (INDEPENDENT_AMBULATORY_CARE_PROVIDER_SITE_OTHER): Payer: Self-pay | Admitting: Vascular Surgery

## 2018-08-10 ENCOUNTER — Other Ambulatory Visit (INDEPENDENT_AMBULATORY_CARE_PROVIDER_SITE_OTHER): Payer: Self-pay | Admitting: Vascular Surgery

## 2018-08-10 VITALS — BP 126/68 | HR 78

## 2018-08-10 DIAGNOSIS — L819 Disorder of pigmentation, unspecified: Secondary | ICD-10-CM | POA: Diagnosis not present

## 2018-08-10 DIAGNOSIS — N183 Chronic kidney disease, stage 3 unspecified: Secondary | ICD-10-CM

## 2018-08-10 DIAGNOSIS — I713 Abdominal aortic aneurysm, ruptured, unspecified: Secondary | ICD-10-CM

## 2018-08-10 DIAGNOSIS — I714 Abdominal aortic aneurysm, without rupture, unspecified: Secondary | ICD-10-CM

## 2018-08-10 DIAGNOSIS — I129 Hypertensive chronic kidney disease with stage 1 through stage 4 chronic kidney disease, or unspecified chronic kidney disease: Secondary | ICD-10-CM | POA: Diagnosis not present

## 2018-08-10 DIAGNOSIS — I749 Embolism and thrombosis of unspecified artery: Secondary | ICD-10-CM

## 2018-08-10 DIAGNOSIS — I10 Essential (primary) hypertension: Secondary | ICD-10-CM

## 2018-08-10 NOTE — Assessment & Plan Note (Signed)
blood pressure control important in reducing the progression of atherosclerotic disease and aneurysmal growth. On appropriate oral medications.  

## 2018-08-10 NOTE — Assessment & Plan Note (Signed)
To evaluate her lower extremity arterial perfusion, noninvasive studies were performed today.  She has normal ABIs of 1.0 bilaterally with triphasic waveforms and nearly normal digital pressures bilaterally.  An aortic duplex was performed showing an approximately 4.4 cm infrarenal abdominal aortic aneurysm. The patient appears to have distal embolization prominent in her forefoot.  The timing started with crease of her Plavix which would clinically correlate.  Given her normal perfusion, this is likely from her aneurysm and not from peripheral arterial disease.  We had a long discussion today given the situation and the patient's multiple illnesses.  She is clearly high risk for any surgery, but this likely represents a symptomatic aneurysm.  At this point, I think we should start by getting a CT angiogram for further evaluation to evaluate whether or not she could be treated with an endograft.  She would likely not be a candidate for open surgical repair.  This can be done at her convenience in the next couple of weeks and we will see her back to discuss the results and determine further treatment options.

## 2018-08-10 NOTE — Assessment & Plan Note (Signed)
To evaluate her lower extremity arterial perfusion, noninvasive studies were performed today.  She has normal ABIs of 1.0 bilaterally with triphasic waveforms and nearly normal digital pressures bilaterally.  An aortic duplex was performed showing an approximately 4.4 cm infrarenal abdominal aortic aneurysm. The patient appears to have distal embolization prominent in her forefoot.  The timing started with crease of her Plavix which would clinically correlate.  Given her normal perfusion, this is likely from her aneurysm and not from peripheral arterial disease.  We had a long discussion today given the situation and the patient's multiple illnesses.  She is clearly high risk for any surgery, but this likely represents a symptomatic aneurysm.  At this point, I think we should start by getting a CT angiogram for further evaluation to evaluate whether or not she could be treated with an endograft.  She would likely not be a candidate for open surgical repair.  This can be done at her convenience in the next couple of weeks and we will see her back to discuss the results and determine further treatment options. 

## 2018-08-10 NOTE — Patient Instructions (Signed)
Abdominal Aortic Aneurysm Blood pumps away from the heart through tubes (blood vessels) called arteries. Aneurysms are weak or damaged places in the wall of an artery. It bulges out like a balloon. An abdominal aortic aneurysm happens in the main artery of the body (aorta). It can burst or tear, causing bleeding inside the body. This is an emergency. It needs treatment right away. What are the causes? The exact cause is unknown. Things that could cause this problem include:  Fat and other substances building up in the lining of a tube.  Swelling of the walls of a blood vessel.  Certain tissue diseases.  Belly (abdominal) trauma.  An infection in the main artery of the body.  What increases the risk? There are things that make it more likely for you to have an aneurysm. These include:  Being over the age of 76 years old.  Having high blood pressure (hypertension).  Being a female.  Being white.  Being very overweight (obese).  Having a family history of aneurysm.  Using tobacco products.  What are the signs or symptoms? Symptoms depend on the size of the aneurysm and how fast it grows. There may not be symptoms. If symptoms occur, they can include:  Pain (belly, side, lower back, or groin).  Feeling full after eating a small amount of food.  Feeling sick to your stomach (nauseous), throwing up (vomiting), or both.  Feeling a lump in your belly that feels like it is beating (pulsating).  Feeling like you will pass out (faint).  How is this treated?  Medicine to control blood pressure and pain.  Imaging tests to see if the aneurysm gets bigger.  Surgery. How is this prevented? To lessen your chance of getting this condition:  Stop smoking. Stop chewing tobacco.  Limit or avoid alcohol.  Keep your blood pressure, blood sugar, and cholesterol within normal limits.  Eat less salt.  Eat foods low in saturated fats and cholesterol. These are found in animal and  whole dairy products.  Eat more fiber. Fiber is found in whole grains, vegetables, and fruits.  Keep a healthy weight.  Stay active and exercise often.  This information is not intended to replace advice given to you by your health care provider. Make sure you discuss any questions you have with your health care provider. Document Released: 12/06/2012 Document Revised: 01/17/2016 Document Reviewed: 09/10/2012 Elsevier Interactive Patient Education  2017 [employer]  

## 2018-08-10 NOTE — Progress Notes (Signed)
Patient ID: Carolyn Lewis, female   DOB: 10/10/1940, 77 y.o.   MRN: [id number]  Chief Complaint  Patient presents with  . Follow-up    HPI Carolyn Lewis is a 77 y.o. female.  I am asked to see the patient by Dr. Fanny BienQuale at the [facility] ED for evaluation of an ischemic foot.  The patient reports discoloration of the right forefoot over the past few weeks.  This correlated when her Plavix dose was decreased due to worsening bruising.  The color got dark enough and concerning enough over the weekend for them to go to the emergency department.  She is not having a lot of pain associated with this.  There is some mild discoloration of the left foot.  Mild swelling of the lower extremities.  She has a litany of other medical issues including oxygen dependent COPD and coronary disease.  She has a known history of an abdominal aortic aneurysm as well. To evaluate her lower extremity arterial perfusion, noninvasive studies were performed today.  She has normal ABIs of 1.0 bilaterally with triphasic waveforms and nearly normal digital pressures bilaterally.  An aortic duplex was performed showing an approximately 4.4 cm infrarenal abdominal aortic aneurysm.   Past Medical History:  Diagnosis Date  . AAA (abdominal aortic aneurysm) (HCC)   . Acute MI [facility](HCC)    cath with 2 stents placed  . Anxiety   . Arthritis   . CKD stage G3b/A3, GFR 30 - 44 and albumin creatinine ratio >300 mg/g   . COPD (chronic obstructive pulmonary disease) (HCC)   . Dyspnea   . GERD (gastroesophageal reflux disease)   . Headache    migraines  . Hypertension   . Presence of stent in artery 01/2010   2 cardiac stents  . Wears dentures    full upper and lower    Past Surgical History:  Procedure Laterality Date  . ABDOMINAL HYSTERECTOMY    . CARDIAC CATHETERIZATION  2011  . ESOPHAGEAL DILATION  11/20/2016   Procedure: ESOPHAGEAL DILATION;  Surgeon: Midge Miniumarren Wohl, MD;  [facility];  Service: Endoscopy;;  .  ESOPHAGEAL DILATION N/A Apr 11, 202019   Procedure: ESOPHAGEAL DILATION;  Surgeon: Midge MiniumWohl, Darren, MD;  [facility];  Service: Endoscopy;  Laterality: N/A;  . ESOPHAGEAL DILATION  02/15/2018   Procedure: ESOPHAGEAL DILATION;  Surgeon: Midge MiniumWohl, Darren, MD;  [facility];  Service: Endoscopy;;  . ESOPHAGEAL DILATION N/A 03/19/2018   Procedure: ESOPHAGEAL DILATION;  Surgeon: Midge MiniumWohl, Darren, MD;  [facility];  Service: Endoscopy;  Laterality: N/A;  . ESOPHAGOGASTRODUODENOSCOPY (EGD) WITH PROPOFOL N/A 11/20/2016   Procedure: ESOPHAGOGASTRODUODENOSCOPY (EGD) WITH PROPOFOL;  Surgeon: Midge Miniumarren Wohl, MD;  [facility];  Service: Endoscopy;  Laterality: N/A;  . ESOPHAGOGASTRODUODENOSCOPY (EGD) WITH PROPOFOL N/A Apr 11, 202019   Procedure: ESOPHAGOGASTRODUODENOSCOPY (EGD) WITH PROPOFOL;  Surgeon: Midge MiniumWohl, Darren, MD;  [facility];  Service: Endoscopy;  Laterality: N/A;  . ESOPHAGOGASTRODUODENOSCOPY (EGD) WITH PROPOFOL N/A 02/15/2018   Procedure: ESOPHAGOGASTRODUODENOSCOPY (EGD) WITH PROPOFOL;  Surgeon: Midge MiniumWohl, Darren, MD;  [facility];  Service: Endoscopy;  Laterality: N/A;  . ESOPHAGOGASTRODUODENOSCOPY (EGD) WITH PROPOFOL N/A 03/19/2018   Procedure: ESOPHAGOGASTRODUODENOSCOPY (EGD) WITH PROPOFOL;  Surgeon: Midge MiniumWohl, Darren, MD;  [facility];  Service: Endoscopy;  Laterality: N/A;    Family History No bleeding disorders, clotting disorders, autoimmune diseases, or porphyrias  Social History Social History   Tobacco Use  . Smoking status: Former Smoker    Packs/day:  1.00    Years: 15.00    Pack years: 15.00    Types: Cigarettes    Last attempt to quit: 2015    Years since quitting: 4.9  . Smokeless tobacco: Never Used  Substance Use Topics  . Alcohol use: No  . Drug use: No    Allergies  Allergen Reactions  . Sulfa Antibiotics Rash    Current Outpatient Medications  Medication Sig Dispense Refill    . amitriptyline (ELAVIL) 25 MG tablet Take 25 mg by mouth at bedtime.    Marland Kitchen amLODipine (NORVASC) 5 MG tablet Take 5 mg by mouth daily.    Marland Kitchen aspirin EC 81 MG tablet Take 1 tablet (81 mg total) by mouth daily. 30 tablet 0  . atorvastatin (LIPITOR) 40 MG tablet Take 40 mg by mouth daily.    . clopidogrel (PLAVIX) 75 MG tablet Take 75 mg by mouth daily.    . diazepam (VALIUM) 2 MG tablet Take 2 mg by mouth 2 (two) times daily.    . enalapril (VASOTEC) 2.5 MG tablet Take 2.5 mg by mouth daily.    . famotidine (PEPCID) 20 MG tablet Take 20 mg by mouth daily.    . furosemide (LASIX) 20 MG tablet Take 20 mg by mouth daily. Patient only takes Monday, Wednesday, and Friday.     . Ipratropium-Albuterol (COMBIVENT RESPIMAT) 20-100 MCG/ACT AERS respimat Inhale 1 puff into the lungs 3 (three) times daily as needed for wheezing or shortness of breath.    . levalbuterol (XOPENEX) 1.25 MG/3ML nebulizer solution Take 1.25 mg by nebulization every 4 (four) hours as needed for wheezing or shortness of breath.    . metoprolol (LOPRESSOR) 50 MG tablet Take 1 tablet (50 mg total) by mouth 2 (two) times daily. 60 tablet 0  . nitroGLYCERIN (NITROSTAT) 0.4 MG SL tablet Place 0.4 mg under the tongue every 5 (five) minutes as needed for chest pain.    . pantoprazole (PROTONIX) 40 MG tablet Take 1 tablet (40 mg total) by mouth 2 (two) times daily. 30 tablet 11  . predniSONE (DELTASONE) 20 MG tablet Take 2 tablets (40 mg total) by mouth daily. 10 tablet 0  . traMADol (ULTRAM) 50 MG tablet Take 50 mg by mouth 2 (two) times daily.     No current facility-administered medications for this visit.       REVIEW OF SYSTEMS (Negative unless checked)  Constitutional: [] Weight loss  [] Fever  [] Chills Cardiac: [] Chest pain   [] Chest pressure   [x] Palpitations   [] Shortness of breath when laying flat   [] Shortness of breath at rest   [] Shortness of breath with exertion. Vascular:  [] Pain in legs with walking   [] Pain in legs at  rest   [] Pain in legs when laying flat   [] Claudication   [x] Pain in feet when walking  [x] Pain in feet at rest  [] Pain in feet when laying flat   [] History of DVT   [] Phlebitis   [x] Swelling in legs   [] Varicose veins   [] Non-healing ulcers Pulmonary:   [] Uses home oxygen   [] Productive cough   [] Hemoptysis   [] Wheeze  [] COPD   [] Asthma Neurologic:  [] Dizziness  [] Blackouts   [] Seizures   [] History of stroke   [] History of TIA  [] Aphasia   [] Temporary blindness   [] Dysphagia   [] Weakness or numbness in arms   [] Weakness or numbness in legs Musculoskeletal:  [x] Arthritis   [] Joint swelling   [] Joint pain   [] Low back pain Hematologic:  [x] Easy bruising  []   Easy bleeding   [] Hypercoagulable state   [] Anemic  [] Hepatitis Gastrointestinal:  [] Blood in stool   [] Vomiting blood  [] Gastroesophageal reflux/heartburn   [] Abdominal pain Genitourinary:  [x] Chronic kidney disease   [] Difficult urination  [] Frequent urination  [] Burning with urination   [] Hematuria Skin:  [] Rashes   [] Ulcers   [] Wounds Psychological:  [] History of anxiety   []  History of major depression.    Physical Exam BP 126/68 (BP Location: Right Arm, Patient Position: Sitting)   Pulse 78  Gen:  Somewhat debilitated appearing, NAD Head: [place]/AT, No temporalis wasting.  Ear/Nose/Throat: Hearing grossly intact, nares w/o erythema or drainage, oropharynx w/o Erythema/Exudate Eyes: Conjunctiva clear, sclera non-icteric  Neck: trachea midline.   Pulmonary:  Good air movement, respirations not labored, no use of accessory muscles on supplemental oxygen Cardiac: RRR, no JVD Vascular:  Vessel Right Left  Radial Palpable Palpable                          PT  1+ palpable  1+ palpable  DP  1+ palpable  1+ palpable   Gastrointestinal: soft, non-tender/non-distended.  Increased aortic impulse Musculoskeletal: M/S 5/5 throughout.  Uses a wheelchair.  Mild lower extremity swelling.  Purplish discoloration of the forefoot particularly on  the lateral aspect of the right foot.  This is present to a lesser degree on the left foot. Neurologic: Sensation grossly intact in extremities.  Symmetrical.  Speech is fluent. Motor exam as listed above. Psychiatric: Judgment intact, Mood & affect appropriate for pt's clinical situation. Dermatologic: Diffuse bruising is present throughout the arms and legs    Radiology Dg Chest 2 View  Result Date: 08/06/2018 CLINICAL DATA:  Bilateral lower extremity swelling. EXAM: CHEST - 2 VIEW COMPARISON:  Single-view of the chest 06/16/2016. PA and lateral chest 03/23/2015. FINDINGS: The chest is hyperexpanded with attenuation of the pulmonary vasculature. The lungs are clear. No pneumothorax or pleural fluid. Heart size is normal. Atherosclerosis is noted. No acute or focal bony abnormality. Mild convex left thoracic scoliosis is seen. IMPRESSION: Emphysema without acute disease. Electronically Signed   By: Drusilla Kanner M.D.   On: 08/06/2018 20:24   US Venous Img Lower Bilateral  Result Date: 08/06/2018 CLINICAL DATA:  BILATERAL lower extremity swelling with bruising to RIGHT foot EXAM: BILATERAL LOWER EXTREMITY VENOUS DOPPLER ULTRASOUND TECHNIQUE: Gray-scale sonography with graded compression, as well as color Doppler and duplex ultrasound were performed to evaluate the lower extremity deep venous systems from the level of the common femoral vein and including the common femoral, femoral, profunda femoral, popliteal and calf veins including the posterior tibial, peroneal and gastrocnemius veins when visible. The superficial great saphenous vein was also interrogated. Spectral Doppler was utilized to evaluate flow at rest and with distal augmentation maneuvers in the common femoral, femoral and popliteal veins. COMPARISON:  02/11/2010 FINDINGS: RIGHT LOWER EXTREMITY Common Femoral Vein: No evidence of thrombus. Normal compressibility, respiratory phasicity and response to augmentation. Saphenofemoral  Junction: No evidence of thrombus. Normal compressibility and flow on color Doppler imaging. Profunda Femoral Vein: No evidence of thrombus. Normal compressibility and flow on color Doppler imaging. Femoral Vein: No evidence of thrombus. Normal compressibility, respiratory phasicity and response to augmentation. Popliteal Vein: No evidence of thrombus. Normal compressibility, respiratory phasicity and response to augmentation. Calf Veins: Inadequately evaluated due to calf edema. Superficial Great Saphenous Vein: No evidence of thrombus. Normal compressibility. Venous Reflux:  None. Other Findings:  None. LEFT LOWER EXTREMITY Common Femoral Vein: No  evidence of thrombus. Normal compressibility, respiratory phasicity and response to augmentation. Saphenofemoral Junction: No evidence of thrombus. Normal compressibility and flow on color Doppler imaging. Profunda Femoral Vein: No evidence of thrombus. Normal compressibility and flow on color Doppler imaging. Femoral Vein: No evidence of thrombus. Normal compressibility, respiratory phasicity and response to augmentation. Popliteal Vein: No evidence of thrombus. Normal compressibility, respiratory phasicity and response to augmentation. Calf Veins: Inadequately evaluated due to calf edema. Superficial Great Saphenous Vein: No evidence of thrombus. Normal compressibility. Venous Reflux:  None. Other Findings:  None. IMPRESSION: No evidence of above knee deep venous thrombosis in either lower extremity. Nondiagnostic assessment of deep veins in the calves bilaterally. Electronically Signed   By: Ulyses Southward M.D.   On: 08/06/2018 17:45   Dg Foot Complete Right  Result Date: 08/06/2018 CLINICAL DATA:  Right foot bruising EXAM: RIGHT FOOT COMPLETE - 3+ VIEW COMPARISON:  None. FINDINGS: Mild osteopenia. No acute fracture or dislocation. No erosion or periosteal reaction. Normal soft tissues. IMPRESSION: No acute osseous injury of the right foot. Electronically Signed    By: Deatra Robinson M.D.   On: 08/06/2018 20:25    Labs Recent Results (from the past 2160 hour(s))  CBC     Status: Abnormal   Collection Time: 08/06/18  4:33 PM  Result Value Ref Range   WBC 14.8 (H) 4.0 - 10.5 K/uL   RBC 4.41 3.87 - 5.11 MIL/uL   Hemoglobin 13.0 12.0 - 15.0 g/dL   HCT 09.6 04.5 - 40.9 %   MCV 94.6 80.0 - 100.0 fL   MCH 29.5 26.0 - 34.0 pg   MCHC 31.2 30.0 - 36.0 g/dL   RDW 81.1 91.4 - 78.2 %   Platelets 402 (H) 150 - 400 K/uL   nRBC 0.0 0.0 - 0.2 %    Comment: Performed at [facility], [address] [place], [place] [address]  Comprehensive metabolic panel     Status: Abnormal   Collection Time: 08/06/18  4:33 PM  Result Value Ref Range   Sodium 135 135 - 145 mmol/L   Potassium 4.4 3.5 - 5.1 mmol/L   Chloride 97 (L) 98 - 111 mmol/L   CO2 26 22 - 32 mmol/L   Glucose, Bld 92 70 - 99 mg/dL   BUN 35 (H) 8 - 23 mg/dL   Creatinine, Ser 3.08 (H) 0.44 - 1.00 mg/dL   Calcium 9.3 8.9 - 65.7 mg/dL   Total Protein 8.4 (H) 6.5 - 8.1 g/dL   Albumin 4.6 3.5 - 5.0 g/dL   AST 28 15 - 41 U/L   ALT 21 0 - 44 U/L   Alkaline Phosphatase 103 38 - 126 U/L   Total Bilirubin 0.4 0.3 - 1.2 mg/dL   GFR calc non Af Amer 35 (L) >60 mL/min   GFR calc Af Amer 40 (L) >60 mL/min   Anion gap 12 5 - 15    Comment: Performed at [facility], [address] [place], [place] [address]    Assessment/Plan:  HTN (hypertension) blood pressure control important in reducing the progression of atherosclerotic disease and aneurysmal growth. On appropriate oral medications.   CKD (chronic kidney disease), stage III Will need to maintain hydration for imaging as well as any potential treatment.  CT scan will be necessary prior to potential aneurysm repair for symptomatic aneurysm  Cholesterol embolization syndrome (HCC) To evaluate her lower extremity arterial perfusion, noninvasive studies were performed today.  She has normal ABIs of 1.0 bilaterally  with triphasic  waveforms and nearly normal digital pressures bilaterally.  An aortic duplex was performed showing an approximately 4.4 cm infrarenal abdominal aortic aneurysm. The patient appears to have distal embolization prominent in her forefoot.  The timing started with crease of her Plavix which would clinically correlate.  Given her normal perfusion, this is likely from her aneurysm and not from peripheral arterial disease.  We had a long discussion today given the situation and the patient's multiple illnesses.  She is clearly high risk for any surgery, but this likely represents a symptomatic aneurysm.  At this point, I think we should start by getting a CT angiogram for further evaluation to evaluate whether or not she could be treated with an endograft.  She would likely not be a candidate for open surgical repair.  This can be done at her convenience in the next couple of weeks and we will see her back to discuss the results and determine further treatment options.  AAA (abdominal aortic aneurysm) without rupture (HCC) To evaluate her lower extremity arterial perfusion, noninvasive studies were performed today.  She has normal ABIs of 1.0 bilaterally with triphasic waveforms and nearly normal digital pressures bilaterally.  An aortic duplex was performed showing an approximately 4.4 cm infrarenal abdominal aortic aneurysm. The patient appears to have distal embolization prominent in her forefoot.  The timing started with crease of her Plavix which would clinically correlate.  Given her normal perfusion, this is likely from her aneurysm and not from peripheral arterial disease.  We had a long discussion today given the situation and the patient's multiple illnesses.  She is clearly high risk for any surgery, but this likely represents a symptomatic aneurysm.  At this point, I think we should start by getting a CT angiogram for further evaluation to evaluate whether or not she could be treated with an endograft.   She would likely not be a candidate for open surgical repair.  This can be done at her convenience in the next couple of weeks and we will see her back to discuss the results and determine further treatment options.      Festus Barren 08/10/2018, 1:17 PM   This note was created with Dragon medical transcription system.  Any errors from dictation are unintentional.

## 2018-08-10 NOTE — Assessment & Plan Note (Signed)
Will need to maintain hydration for imaging as well as any potential treatment.  CT scan will be necessary prior to potential aneurysm repair for symptomatic aneurysm

## 2018-08-16 DIAGNOSIS — J449 Chronic obstructive pulmonary disease, unspecified: Secondary | ICD-10-CM | POA: Diagnosis not present

## 2018-09-16 DIAGNOSIS — J449 Chronic obstructive pulmonary disease, unspecified: Secondary | ICD-10-CM | POA: Diagnosis not present

## 2018-09-17 DIAGNOSIS — L97501 Non-pressure chronic ulcer of other part of unspecified foot limited to breakdown of skin: Secondary | ICD-10-CM | POA: Diagnosis not present

## 2018-09-17 DIAGNOSIS — R627 Adult failure to thrive: Secondary | ICD-10-CM | POA: Diagnosis not present

## 2018-09-17 DIAGNOSIS — R29818 Other symptoms and signs involving the nervous system: Secondary | ICD-10-CM | POA: Diagnosis not present

## 2018-09-17 DIAGNOSIS — N182 Chronic kidney disease, stage 2 (mild): Secondary | ICD-10-CM | POA: Diagnosis not present

## 2018-10-01 DIAGNOSIS — K222 Esophageal obstruction: Secondary | ICD-10-CM | POA: Diagnosis not present

## 2018-10-01 DIAGNOSIS — I714 Abdominal aortic aneurysm, without rupture: Secondary | ICD-10-CM | POA: Diagnosis not present

## 2018-10-01 DIAGNOSIS — G43709 Chronic migraine without aura, not intractable, without status migrainosus: Secondary | ICD-10-CM | POA: Diagnosis not present

## 2018-10-01 DIAGNOSIS — R627 Adult failure to thrive: Secondary | ICD-10-CM | POA: Diagnosis not present

## 2018-10-17 DIAGNOSIS — J449 Chronic obstructive pulmonary disease, unspecified: Secondary | ICD-10-CM | POA: Diagnosis not present

## 2018-11-15 DIAGNOSIS — J449 Chronic obstructive pulmonary disease, unspecified: Secondary | ICD-10-CM | POA: Diagnosis not present

## 2018-11-26 DIAGNOSIS — I208 Other forms of angina pectoris: Secondary | ICD-10-CM | POA: Diagnosis not present

## 2018-11-26 DIAGNOSIS — K222 Esophageal obstruction: Secondary | ICD-10-CM | POA: Diagnosis not present

## 2018-11-26 DIAGNOSIS — G43709 Chronic migraine without aura, not intractable, without status migrainosus: Secondary | ICD-10-CM | POA: Diagnosis not present

## 2018-11-26 DIAGNOSIS — I714 Abdominal aortic aneurysm, without rupture: Secondary | ICD-10-CM | POA: Diagnosis not present

## 2018-11-29 ENCOUNTER — Other Ambulatory Visit: Payer: Self-pay

## 2018-11-29 ENCOUNTER — Emergency Department: Payer: Medicare Other

## 2018-11-29 ENCOUNTER — Emergency Department
Admission: EM | Admit: 2018-11-29 | Discharge: 2018-11-29 | Disposition: A | Discharge status: Home / Self Care | Payer: Medicare Other | Attending: Emergency Medicine | Admitting: Emergency Medicine

## 2018-11-29 DIAGNOSIS — J441 Chronic obstructive pulmonary disease with (acute) exacerbation: Secondary | ICD-10-CM

## 2018-11-29 DIAGNOSIS — Z9981 Dependence on supplemental oxygen: Secondary | ICD-10-CM | POA: Diagnosis not present

## 2018-11-29 DIAGNOSIS — I129 Hypertensive chronic kidney disease with stage 1 through stage 4 chronic kidney disease, or unspecified chronic kidney disease: Secondary | ICD-10-CM | POA: Diagnosis not present

## 2018-11-29 DIAGNOSIS — I959 Hypotension, unspecified: Secondary | ICD-10-CM | POA: Diagnosis not present

## 2018-11-29 DIAGNOSIS — R0602 Shortness of breath: Secondary | ICD-10-CM | POA: Diagnosis not present

## 2018-11-29 DIAGNOSIS — N183 Chronic kidney disease, stage 3 (moderate): Secondary | ICD-10-CM | POA: Insufficient documentation

## 2018-11-29 DIAGNOSIS — Z87891 Personal history of nicotine dependence: Secondary | ICD-10-CM | POA: Diagnosis not present

## 2018-11-29 DIAGNOSIS — Z955 Presence of coronary angioplasty implant and graft: Secondary | ICD-10-CM | POA: Diagnosis not present

## 2018-11-29 DIAGNOSIS — R069 Unspecified abnormalities of breathing: Secondary | ICD-10-CM | POA: Diagnosis not present

## 2018-11-29 LAB — CBC WITH DIFFERENTIAL/PLATELET
Abs Immature Granulocytes: 0.05 10*3/uL (ref 0.00–0.07)
Basophils Absolute: 0 10*3/uL (ref 0.0–0.1)
Basophils Relative: 0 %
Eosinophils Absolute: 0.5 10*3/uL (ref 0.0–0.5)
Eosinophils Relative: 4 %
HCT: 30.4 % — ABNORMAL LOW (ref 36.0–46.0)
Hemoglobin: 9.7 g/dL — ABNORMAL LOW (ref 12.0–15.0)
Immature Granulocytes: 0 %
Lymphocytes Relative: 17 %
Lymphs Abs: 2.2 10*3/uL (ref 0.7–4.0)
MCH: 29 pg (ref 26.0–34.0)
MCHC: 31.9 g/dL (ref 30.0–36.0)
MCV: 90.7 fL (ref 80.0–100.0)
Monocytes Absolute: 1.1 10*3/uL — ABNORMAL HIGH (ref 0.1–1.0)
Monocytes Relative: 8 %
Neutro Abs: 9.3 10*3/uL — ABNORMAL HIGH (ref 1.7–7.7)
Neutrophils Relative %: 71 %
Platelets: 408 10*3/uL — ABNORMAL HIGH (ref 150–400)
RBC: 3.35 MIL/uL — ABNORMAL LOW (ref 3.87–5.11)
RDW: 13.7 % (ref 11.5–15.5)
WBC: 13.2 10*3/uL — ABNORMAL HIGH (ref 4.0–10.5)
nRBC: 0 % (ref 0.0–0.2)

## 2018-11-29 LAB — BASIC METABOLIC PANEL
Anion gap: 10 (ref 5–15)
BUN: 33 mg/dL — ABNORMAL HIGH (ref 8–23)
CO2: 32 mmol/L (ref 22–32)
Calcium: 8.4 mg/dL — ABNORMAL LOW (ref 8.9–10.3)
Chloride: 84 mmol/L — ABNORMAL LOW (ref 98–111)
Creatinine, Ser: 1.62 mg/dL — ABNORMAL HIGH (ref 0.44–1.00)
GFR calc Af Amer: 35 mL/min — ABNORMAL LOW (ref >60)
GFR calc non Af Amer: 31 mL/min — ABNORMAL LOW (ref >60)
Glucose, Bld: 80 mg/dL (ref 70–99)
Potassium: 4.4 mmol/L (ref 3.5–5.1)
Sodium: 126 mmol/L — ABNORMAL LOW (ref 135–145)

## 2018-11-29 LAB — TROPONIN I: Troponin I: <0.03 ng/mL (ref <0.03)

## 2018-11-29 LAB — BRAIN NATRIURETIC PEPTIDE: B Natriuretic Peptide: 103 pg/mL — ABNORMAL HIGH (ref 0.0–100.0)

## 2018-11-29 MED ORDER — PREDNISONE 20 MG PO TABS
60.0000 mg | ORAL_TABLET | Freq: Once | ORAL | Status: AC
  Administered 2018-11-29: 60 mg via ORAL
  Filled 2018-11-29: qty 3

## 2018-11-29 MED ORDER — AZITHROMYCIN 250 MG PO TABS: ORAL_TABLET | ORAL | 0 refills | Status: DC

## 2018-11-29 MED ORDER — PREDNISONE 10 MG (21) PO TBPK: ORAL_TABLET | ORAL | 0 refills | Status: DC

## 2018-11-29 MED ORDER — SODIUM CHLORIDE 0.9 % IV BOLUS
500.0000 mL | Freq: Once | INTRAVENOUS | Status: AC
  Administered 2018-11-29: 500 mL via INTRAVENOUS

## 2018-11-29 MED ORDER — IPRATROPIUM-ALBUTEROL 0.5-2.5 (3) MG/3ML IN SOLN
3.0000 mL | Freq: Once | RESPIRATORY_TRACT | Status: AC
  Administered 2018-11-29: 3 mL via RESPIRATORY_TRACT
  Filled 2018-11-29: qty 3

## 2018-11-29 NOTE — ED Triage Notes (Signed)
Pt arrived via [facility] from home c/o difficulty breathing and SOB. Pt was wheezing last night while sleeping per husband. O2 sats on arrival was 80% pt is normally on 2L at home. Pt given 1 duoneb by ems. Hx of COPD and MI with stents. Wounds to left foot being treated by physician.

## 2018-11-29 NOTE — ED Provider Notes (Signed)
Patient declined admission into the hospital on multiple occasions.  Ideally we would like to admit her but she has declined.   Carolyn Filbert, MD 11/29/18 [phone]

## 2018-11-29 NOTE — ED Provider Notes (Addendum)
[facility] Emergency Department Provider Note       Time seen: ----------------------------------------- 12:44 PM on 11/29/2018 -----------------------------------------   I have reviewed the triage vital signs and the nursing notes.  HISTORY   Chief Complaint Shortness of Breath    HPI Carolyn Lewis is a 78 y.o. female with a history of AAA, acute MI, arthritis, chronic kidney disease, COPD, GERD, hypertension who presents to the ED for occulta breathing and shortness of breath.  Patient was wheezing last night according to her husband.  Oxygen saturations are 80% but she is on 2 L of oxygen chronically at home.  She was given a DuoNeb in route by EMS.  She has a history of COPD and MI with stents.  Past Medical History:  Diagnosis Date  . AAA (abdominal aortic aneurysm) (HCC)   . Acute MI [facility](HCC)    cath with 2 stents placed  . Anxiety   . Arthritis   . CKD stage G3b/A3, GFR 30 - 44 and albumin creatinine ratio >300 mg/g   . COPD (chronic obstructive pulmonary disease) (HCC)   . Dyspnea   . GERD (gastroesophageal reflux disease)   . Headache    migraines  . Hypertension   . Presence of stent in artery 01/2010   2 cardiac stents  . Wears dentures    full upper and lower    Patient Active Problem List   Diagnosis Date Noted  . 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 AAA (abdominal aortic aneurysm) without rupture (HCC) 08/10/2018  . Stricture esophagus   . Dysphagia   . Problems with swallowing and mastication   . Stricture and stenosis of esophagus   . Gastritis without bleeding   . COPD exacerbation (HCC) 06/16/2016  . Anxiety 06/16/2016  . GERD (gastroesophageal reflux disease) 06/16/2016  . HTN (hypertension) 06/16/2016  . CKD (chronic kidney disease), stage III (HCC) 06/16/2016  . Fever 02/06/2015    Past Surgical History:  Procedure Laterality Date  . ABDOMINAL HYSTERECTOMY    . CARDIAC CATHETERIZATION  2011  .  ESOPHAGEAL DILATION  11/20/2016   Procedure: ESOPHAGEAL DILATION;  Surgeon: Midge Miniumarren Wohl, MD;  [facility];  Service: Endoscopy;;  . ESOPHAGEAL DILATION N/A August 03, 202019   Procedure: ESOPHAGEAL DILATION;  Surgeon: Midge MiniumWohl, Darren, MD;  [facility];  Service: Endoscopy;  Laterality: N/A;  . ESOPHAGEAL DILATION  02/15/2018   Procedure: ESOPHAGEAL DILATION;  Surgeon: Midge MiniumWohl, Darren, MD;  [facility];  Service: Endoscopy;;  . ESOPHAGEAL DILATION N/A 03/19/2018   Procedure: ESOPHAGEAL DILATION;  Surgeon: Midge MiniumWohl, Darren, MD;  [facility];  Service: Endoscopy;  Laterality: N/A;  . ESOPHAGOGASTRODUODENOSCOPY (EGD) WITH PROPOFOL N/A 11/20/2016   Procedure: ESOPHAGOGASTRODUODENOSCOPY (EGD) WITH PROPOFOL;  Surgeon: Midge Miniumarren Wohl, MD;  [facility];  Service: Endoscopy;  Laterality: N/A;  . ESOPHAGOGASTRODUODENOSCOPY (EGD) WITH PROPOFOL N/A August 03, 202019   Procedure: ESOPHAGOGASTRODUODENOSCOPY (EGD) WITH PROPOFOL;  Surgeon: Midge MiniumWohl, Darren, MD;  [facility];  Service: Endoscopy;  Laterality: N/A;  . ESOPHAGOGASTRODUODENOSCOPY (EGD) WITH PROPOFOL N/A 02/15/2018   Procedure: ESOPHAGOGASTRODUODENOSCOPY (EGD) WITH PROPOFOL;  Surgeon: Midge MiniumWohl, Darren, MD;  [facility];  Service: Endoscopy;  Laterality: N/A;  . ESOPHAGOGASTRODUODENOSCOPY (EGD) WITH PROPOFOL N/A 03/19/2018   Procedure: ESOPHAGOGASTRODUODENOSCOPY (EGD) WITH PROPOFOL;  Surgeon: Midge MiniumWohl, Darren, MD;  [facility];  Service: Endoscopy;  Laterality: N/A;    Allergies Sulfa antibiotics  Social History Social History   Tobacco Use  . Smoking status:  Former Smoker    Packs/day: 1.00    Years: 15.00    Pack years: 15.00    Types: Cigarettes    Last attempt to quit: 2015    Years since quitting: 5.2  . Smokeless tobacco: Never Used  Substance Use Topics  . Alcohol use: No  . Drug use: No   Review of Systems Constitutional: Negative  for fever. Cardiovascular: Negative for chest pain. Respiratory: Positive for shortness of breath and wheezing Gastrointestinal: Negative for abdominal pain, vomiting and diarrhea. Musculoskeletal: Negative for back pain. Skin: Negative for rash. Neurological: Negative for headaches, focal weakness or numbness.  All systems negative/normal/unremarkable except as stated in the HPI  ____________________________________________   PHYSICAL EXAM:  VITAL SIGNS: ED Triage Vitals  Enc Vitals Group     BP 11/29/18 1235 132/64     Pulse Rate 11/29/18 1235 92     Resp 11/29/18 1235 16     Temp 11/29/18 1235 99.1 F (37.3 C)     Temp Source 11/29/18 1235 Oral     SpO2 11/29/18 1235 100 %     Weight 11/29/18 1236 125 lb (56.7 kg)     Height 11/29/18 1236  (1.702 m)     Head Circumference --      Peak Flow --      Pain Score 11/29/18 1236 8     Pain Loc --      Pain Edu? --      Excl. in GC? --    Constitutional: Alert and oriented.  Chronically ill-appearing, no distress Eyes: Conjunctivae are normal. Normal extraocular movements. ENT      Head: Normocephalic and atraumatic.      Nose: No congestion/rhinnorhea.      Mouth/Throat: Mucous membranes are moist.      Neck: No stridor. Cardiovascular: Normal rate, regular rhythm. No murmurs, rubs, or gallops. Respiratory: Minimal wheezing and rhonchi, worse on the left Gastrointestinal: Soft and nontender. Normal bowel sounds Musculoskeletal: Nontender with normal range of motion in extremities. No lower extremity tenderness nor edema. Neurologic:  Normal speech and language. No gross focal neurologic deficits are appreciated.  Skin:  Skin is warm, dry and intact.  Scattered bruises are noted Psychiatric: Mood and affect are normal. Speech and behavior are normal.  ____________________________________________  EKG: Interpreted by me.  Sinus rhythm the rate of 91 bpm, left anterior fascicular block, normal  QT  ____________________________________________  ED COURSE:  As part of my medical decision making, I reviewed the following data within the electronic MEDICAL RECORD NUMBER History obtained from family if available, nursing notes, old chart and ekg, as well as notes from prior ED visits. Patient presented for dyspnea, we will assess with labs and imaging as indicated at this time.   Procedures Carolyn Lewis was evaluated in Emergency Department on 11/29/2018 for the symptoms described in the history of present illness. She was evaluated in the context of the global COVID-19 pandemic, which necessitated consideration that the patient might be at risk for infection with the SARS-CoV-2 virus that causes COVID-19. Institutional protocols and algorithms that pertain to the evaluation of patients at risk for COVID-19 are in a state of rapid change based on information released by regulatory bodies including the CDC and federal and state organizations. These policies and algorithms were followed during the patient's care in the ED.  ____________________________________________   LABS (pertinent positives/negatives)  Labs Reviewed  CBC WITH DIFFERENTIAL/PLATELET - Abnormal; Notable for the following components:  Result Value   WBC 13.2 (*)    RBC 3.35 (*)    Hemoglobin 9.7 (*)    HCT 30.4 (*)    Platelets 408 (*)    Neutro Abs 9.3 (*)    Monocytes Absolute 1.1 (*)    All other components within normal limits  BASIC METABOLIC PANEL - Abnormal; Notable for the following components:   Sodium 126 (*)    Chloride 84 (*)    BUN 33 (*)    Creatinine, Ser 1.62 (*)    Calcium 8.4 (*)    GFR calc non Af Amer 31 (*)    GFR calc Af Amer 35 (*)    All other components within normal limits  BRAIN NATRIURETIC PEPTIDE - Abnormal; Notable for the following components:   B Natriuretic Peptide 103.0 (*)    All other components within normal limits  TROPONIN I    RADIOLOGY Images were viewed by  me  Chest x-ray IMPRESSION: Stable findings of chronic obstructive pulmonary disease. No acute cardiopulmonary process.  ____________________________________________   DIFFERENTIAL DIAGNOSIS   CHF, COPD, pneumonia, influenza, coronavirus  FINAL ASSESSMENT AND PLAN  COPD exacerbation, hyponatremia   Plan: The patient had presented for dyspnea. Patient's labs did reveal hyponatremia and chronic kidney disease.  Also anemia which appears to be new compared to the last blood draw that she had.  Patient's imaging did not reveal any acute process.  I offered admission but she has declined.  I will treat her like a COPD exacerbation and advised her to return for worsening or worrisome symptoms.   Ulice Dash, MD    Note: This note was generated in part or whole with voice recognition software. Voice recognition is usually quite accurate but there are transcription errors that can and very often do occur. I apologize for any typographical errors that were not detected and corrected.     Emily Filbert, MD 11/29/18 1428    Emily Filbert, MD 11/29/18 [phone]

## 2018-11-29 NOTE — ED Notes (Signed)
Pt brought to ED lobby via wheelchair. Pt's spouse states he forget pt's O2. Pt's granddaughter to stay with patient outside ED until spouse returns. First nurse Vikki Ports notified pt is on O2 in wheelchair in front of ED with granddaughter and to look for pt's spouse to return to pick her up. Pt visible from first nurse desk.

## 2018-11-29 NOTE — ED Notes (Signed)

## 2018-12-16 DIAGNOSIS — J449 Chronic obstructive pulmonary disease, unspecified: Secondary | ICD-10-CM | POA: Diagnosis not present

## 2018-12-20 ENCOUNTER — Emergency Department

## 2018-12-20 ENCOUNTER — Other Ambulatory Visit: Payer: Self-pay

## 2018-12-20 ENCOUNTER — Inpatient Hospital Stay
Admission: EM | Admit: 2018-12-20 | Discharge: 2018-12-21 | DRG: 190 | Disposition: A | Discharge status: Hospice | Attending: Internal Medicine | Admitting: Internal Medicine

## 2018-12-20 DIAGNOSIS — J690 Pneumonitis due to inhalation of food and vomit: Secondary | ICD-10-CM | POA: Diagnosis present

## 2018-12-20 DIAGNOSIS — Z882 Allergy status to sulfonamides status: Secondary | ICD-10-CM

## 2018-12-20 DIAGNOSIS — I2510 Atherosclerotic heart disease of native coronary artery without angina pectoris: Secondary | ICD-10-CM | POA: Diagnosis present

## 2018-12-20 DIAGNOSIS — Z7401 Bed confinement status: Secondary | ICD-10-CM

## 2018-12-20 DIAGNOSIS — R0689 Other abnormalities of breathing: Secondary | ICD-10-CM | POA: Diagnosis not present

## 2018-12-20 DIAGNOSIS — J9621 Acute and chronic respiratory failure with hypoxia: Secondary | ICD-10-CM | POA: Diagnosis present

## 2018-12-20 DIAGNOSIS — I714 Abdominal aortic aneurysm, without rupture: Secondary | ICD-10-CM | POA: Diagnosis present

## 2018-12-20 DIAGNOSIS — Z79899 Other long term (current) drug therapy: Secondary | ICD-10-CM

## 2018-12-20 DIAGNOSIS — Z7902 Long term (current) use of antithrombotics/antiplatelets: Secondary | ICD-10-CM

## 2018-12-20 DIAGNOSIS — R001 Bradycardia, unspecified: Secondary | ICD-10-CM | POA: Diagnosis not present

## 2018-12-20 DIAGNOSIS — I959 Hypotension, unspecified: Secondary | ICD-10-CM | POA: Diagnosis not present

## 2018-12-20 DIAGNOSIS — Z515 Encounter for palliative care: Secondary | ICD-10-CM | POA: Diagnosis not present

## 2018-12-20 DIAGNOSIS — M1990 Unspecified osteoarthritis, unspecified site: Secondary | ICD-10-CM | POA: Diagnosis present

## 2018-12-20 DIAGNOSIS — N183 Chronic kidney disease, stage 3 (moderate): Secondary | ICD-10-CM | POA: Diagnosis present

## 2018-12-20 DIAGNOSIS — R404 Transient alteration of awareness: Secondary | ICD-10-CM

## 2018-12-20 DIAGNOSIS — Z9981 Dependence on supplemental oxygen: Secondary | ICD-10-CM

## 2018-12-20 DIAGNOSIS — R0902 Hypoxemia: Secondary | ICD-10-CM | POA: Diagnosis not present

## 2018-12-20 DIAGNOSIS — Z87891 Personal history of nicotine dependence: Secondary | ICD-10-CM

## 2018-12-20 DIAGNOSIS — J449 Chronic obstructive pulmonary disease, unspecified: Secondary | ICD-10-CM | POA: Diagnosis not present

## 2018-12-20 DIAGNOSIS — Z955 Presence of coronary angioplasty implant and graft: Secondary | ICD-10-CM

## 2018-12-20 DIAGNOSIS — F419 Anxiety disorder, unspecified: Secondary | ICD-10-CM | POA: Diagnosis present

## 2018-12-20 DIAGNOSIS — R000 Tachycardia, unspecified: Secondary | ICD-10-CM | POA: Diagnosis not present

## 2018-12-20 DIAGNOSIS — L03115 Cellulitis of right lower limb: Secondary | ICD-10-CM | POA: Diagnosis present

## 2018-12-20 DIAGNOSIS — E871 Hypo-osmolality and hyponatremia: Secondary | ICD-10-CM | POA: Diagnosis present

## 2018-12-20 DIAGNOSIS — G9341 Metabolic encephalopathy: Secondary | ICD-10-CM | POA: Diagnosis present

## 2018-12-20 DIAGNOSIS — I252 Old myocardial infarction: Secondary | ICD-10-CM

## 2018-12-20 DIAGNOSIS — I129 Hypertensive chronic kidney disease with stage 1 through stage 4 chronic kidney disease, or unspecified chronic kidney disease: Secondary | ICD-10-CM | POA: Diagnosis present

## 2018-12-20 DIAGNOSIS — D72829 Elevated white blood cell count, unspecified: Secondary | ICD-10-CM | POA: Diagnosis not present

## 2018-12-20 DIAGNOSIS — Z7982 Long term (current) use of aspirin: Secondary | ICD-10-CM

## 2018-12-20 DIAGNOSIS — R4182 Altered mental status, unspecified: Secondary | ICD-10-CM | POA: Diagnosis present

## 2018-12-20 DIAGNOSIS — J9601 Acute respiratory failure with hypoxia: Secondary | ICD-10-CM | POA: Diagnosis not present

## 2018-12-20 DIAGNOSIS — K219 Gastro-esophageal reflux disease without esophagitis: Secondary | ICD-10-CM | POA: Diagnosis not present

## 2018-12-20 DIAGNOSIS — Z66 Do not resuscitate: Secondary | ICD-10-CM | POA: Diagnosis not present

## 2018-12-20 DIAGNOSIS — L03116 Cellulitis of left lower limb: Secondary | ICD-10-CM | POA: Diagnosis not present

## 2018-12-20 DIAGNOSIS — L8990 Pressure ulcer of unspecified site, unspecified stage: Secondary | ICD-10-CM

## 2018-12-20 DIAGNOSIS — Z7952 Long term (current) use of systemic steroids: Secondary | ICD-10-CM | POA: Diagnosis not present

## 2018-12-20 DIAGNOSIS — J441 Chronic obstructive pulmonary disease with (acute) exacerbation: Secondary | ICD-10-CM | POA: Diagnosis not present

## 2018-12-20 DIAGNOSIS — R269 Unspecified abnormalities of gait and mobility: Secondary | ICD-10-CM | POA: Diagnosis present

## 2018-12-20 LAB — CBC WITH DIFFERENTIAL/PLATELET
Abs Immature Granulocytes: 0.13 10*3/uL — ABNORMAL HIGH (ref 0.00–0.07)
Basophils Absolute: 0.1 10*3/uL (ref 0.0–0.1)
Basophils Relative: 0 %
Eosinophils Absolute: 0.1 10*3/uL (ref 0.0–0.5)
Eosinophils Relative: 0 %
HCT: 30.2 % — ABNORMAL LOW (ref 36.0–46.0)
Hemoglobin: 9.5 g/dL — ABNORMAL LOW (ref 12.0–15.0)
Immature Granulocytes: 1 %
Lymphocytes Relative: 7 %
Lymphs Abs: 1.3 10*3/uL (ref 0.7–4.0)
MCH: 28.2 pg (ref 26.0–34.0)
MCHC: 31.5 g/dL (ref 30.0–36.0)
MCV: 89.6 fL (ref 80.0–100.0)
Monocytes Absolute: 1 10*3/uL (ref 0.1–1.0)
Monocytes Relative: 5 %
Neutro Abs: 16 10*3/uL — ABNORMAL HIGH (ref 1.7–7.7)
Neutrophils Relative %: 87 %
Platelets: 385 10*3/uL (ref 150–400)
RBC: 3.37 MIL/uL — ABNORMAL LOW (ref 3.87–5.11)
RDW: 14.1 % (ref 11.5–15.5)
WBC: 18.5 10*3/uL — ABNORMAL HIGH (ref 4.0–10.5)
nRBC: 0 % (ref 0.0–0.2)

## 2018-12-20 LAB — LACTIC ACID, PLASMA: Lactic Acid, Venous: 1.1 mmol/L (ref 0.5–1.9)

## 2018-12-20 LAB — COMPREHENSIVE METABOLIC PANEL
ALT: 12 U/L (ref 0–44)
AST: 17 U/L (ref 15–41)
Albumin: 3.1 g/dL — ABNORMAL LOW (ref 3.5–5.0)
Alkaline Phosphatase: 88 U/L (ref 38–126)
Anion gap: 12 (ref 5–15)
BUN: 42 mg/dL — ABNORMAL HIGH (ref 8–23)
CO2: 27 mmol/L (ref 22–32)
Calcium: 8.3 mg/dL — ABNORMAL LOW (ref 8.9–10.3)
Chloride: 92 mmol/L — ABNORMAL LOW (ref 98–111)
Creatinine, Ser: 1.62 mg/dL — ABNORMAL HIGH (ref 0.44–1.00)
GFR calc Af Amer: 35 mL/min — ABNORMAL LOW (ref >60)
GFR calc non Af Amer: 31 mL/min — ABNORMAL LOW (ref >60)
Glucose, Bld: 103 mg/dL — ABNORMAL HIGH (ref 70–99)
Potassium: 4.5 mmol/L (ref 3.5–5.1)
Sodium: 131 mmol/L — ABNORMAL LOW (ref 135–145)
Total Bilirubin: 0.6 mg/dL (ref 0.3–1.2)
Total Protein: 6.4 g/dL — ABNORMAL LOW (ref 6.5–8.1)

## 2018-12-20 LAB — TROPONIN I: Troponin I: 0.03 ng/mL (ref <0.03)

## 2018-12-20 MED ORDER — PREDNISONE 20 MG PO TABS: 20.0000 mg | ORAL_TABLET | Freq: Two times a day (BID) | ORAL | Status: DC

## 2018-12-20 MED ORDER — FUROSEMIDE 20 MG PO TABS: 20.0000 mg | ORAL_TABLET | Freq: Every day | ORAL | Status: DC

## 2018-12-20 MED ORDER — IPRATROPIUM-ALBUTEROL 0.5-2.5 (3) MG/3ML IN SOLN: 3.0000 mL | Freq: Three times a day (TID) | RESPIRATORY_TRACT | Status: DC | PRN

## 2018-12-20 MED ORDER — ONDANSETRON HCL 4 MG/2ML IJ SOLN
4.0000 mg | Freq: Four times a day (QID) | INTRAMUSCULAR | Status: DC | PRN
  Administered 2018-12-21: 4 mg via INTRAVENOUS
  Filled 2018-12-20: qty 2

## 2018-12-20 MED ORDER — PANTOPRAZOLE SODIUM 40 MG PO TBEC
40.0000 mg | DELAYED_RELEASE_TABLET | Freq: Two times a day (BID) | ORAL | Status: DC
  Administered 2018-12-21: 40 mg via ORAL
  Filled 2018-12-20: qty 1

## 2018-12-20 MED ORDER — AMITRIPTYLINE HCL 25 MG PO TABS
25.0000 mg | ORAL_TABLET | Freq: Every day | ORAL | Status: DC
  Administered 2018-12-21: 25 mg via ORAL
  Filled 2018-12-20: qty 1

## 2018-12-20 MED ORDER — ONDANSETRON HCL 4 MG PO TABS: 4.0000 mg | ORAL_TABLET | Freq: Four times a day (QID) | ORAL | Status: DC | PRN

## 2018-12-20 MED ORDER — METOPROLOL TARTRATE 50 MG PO TABS
50.0000 mg | ORAL_TABLET | Freq: Two times a day (BID) | ORAL | Status: DC
  Administered 2018-12-21: 50 mg via ORAL
  Filled 2018-12-20: qty 1

## 2018-12-20 MED ORDER — ACETAMINOPHEN 325 MG PO TABS: 650.0000 mg | ORAL_TABLET | Freq: Four times a day (QID) | ORAL | Status: DC | PRN

## 2018-12-20 MED ORDER — HYOSCYAMINE SULFATE ER 0.375 MG PO TB12
0.3750 mg | ORAL_TABLET | Freq: Two times a day (BID) | ORAL | Status: DC
  Administered 2018-12-21: 0.375 mg via ORAL
  Filled 2018-12-20 (×3): qty 1

## 2018-12-20 MED ORDER — ACETAMINOPHEN 650 MG RE SUPP: 650.0000 mg | Freq: Four times a day (QID) | RECTAL | Status: DC | PRN

## 2018-12-20 MED ORDER — SODIUM CHLORIDE 0.9% FLUSH: 3.0000 mL | INTRAVENOUS | Status: DC | PRN

## 2018-12-20 MED ORDER — ASPIRIN EC 81 MG PO TBEC: 81.0000 mg | DELAYED_RELEASE_TABLET | Freq: Every day | ORAL | Status: DC

## 2018-12-20 MED ORDER — SODIUM CHLORIDE 0.9% FLUSH
3.0000 mL | Freq: Two times a day (BID) | INTRAVENOUS | Status: DC
  Administered 2018-12-21: 3 mL via INTRAVENOUS

## 2018-12-20 MED ORDER — DIAZEPAM 2 MG PO TABS
2.0000 mg | ORAL_TABLET | Freq: Two times a day (BID) | ORAL | Status: DC
  Administered 2018-12-21: 2 mg via ORAL
  Filled 2018-12-20: qty 1

## 2018-12-20 MED ORDER — SODIUM CHLORIDE 0.9 % IV SOLN: 250.0000 mL | INTRAVENOUS | Status: DC | PRN

## 2018-12-20 MED ORDER — ATORVASTATIN CALCIUM 20 MG PO TABS: 40.0000 mg | ORAL_TABLET | Freq: Every day | ORAL | Status: DC

## 2018-12-20 MED ORDER — CLOPIDOGREL BISULFATE 75 MG PO TABS: 75.0000 mg | ORAL_TABLET | Freq: Every day | ORAL | Status: DC

## 2018-12-20 MED ORDER — SODIUM CHLORIDE 0.9 % IV SOLN
1.0000 g | Freq: Once | INTRAVENOUS | Status: AC
  Administered 2018-12-20: 22:00:00 1 g via INTRAVENOUS
  Filled 2018-12-20: qty 10

## 2018-12-20 MED ORDER — ENOXAPARIN SODIUM 30 MG/0.3ML ~~LOC~~ SOLN
30.0000 mg | SUBCUTANEOUS | Status: DC
  Administered 2018-12-21: 30 mg via SUBCUTANEOUS
  Filled 2018-12-20: qty 0.3

## 2018-12-20 MED ORDER — SODIUM CHLORIDE 0.9 % IV SOLN
1.0000 g | INTRAVENOUS | Status: DC
  Filled 2018-12-20: qty 10

## 2018-12-20 NOTE — ED Notes (Addendum)
ED TO INPATIENT HANDOFF REPORT  ED Nurse Name and Phone #:   Elijah Birk RN   [phone]  S Name/Age/Gender Carolyn Lewis 78 y.o. female Room/Bed: ED17A/ED17A  Code Status   Code Status: Full Code  Home/SNF/Other Home Patient oriented to: self, place, time and situation Is this baseline? Yes   Triage Complete: Triage complete  Chief Complaint alt mental status  Triage Note Pt arrives to ED from home via [facility] with c/c of altered mental status. EMS reports Pt is currently on hospice but no DNR has been filed at this time. Family noticed AMS this morning and reported several episodes of vomiting over the last 2 days. Transport vitals reported as 130/86, p 70, NSR, temp 96.1 oral. CBG 130. Pt responds to voice and is oriented to self, NAD.    Allergies Allergies  Allergen Reactions  . Sulfa Antibiotics Rash    Level of Care/Admitting Diagnosis ED Disposition    ED Disposition Condition Comment   Admit  Hospital [facility]: [facility] [[id number]]  Level of Care: Med-Surg [16]  Covid Evaluation: N/A  Diagnosis: Altered mental status [780.97.ICD-9-CM]  Admitting Physician: Delfino Lovett [[id number]]  Attending Physician: Delfino Lovett [[id number]]  Estimated length of stay: past midnight tomorrow  Certification:: I certify this patient will need inpatient services for at least 2 midnights  PT Class (Do Not Modify): Inpatient [101]  PT Acc Code (Do Not Modify): Private [1]       B Medical/Surgery History Past Medical History:  Diagnosis Date  . AAA (abdominal aortic aneurysm) (HCC)   . Acute MI [facility])    cath with 2 stents placed  . Anxiety   . Arthritis   . CKD stage G3b/A3, GFR 30 - 44 and albumin creatinine ratio >300 mg/g   . COPD (chronic obstructive pulmonary disease) (HCC)   . Dyspnea   . GERD (gastroesophageal reflux disease)   . Headache    migraines  . Hypertension   . Presence of stent in artery 01/2010   2 cardiac stents  . Wears dentures    full upper and  lower   Past Surgical History:  Procedure Laterality Date  . ABDOMINAL HYSTERECTOMY    . CARDIAC CATHETERIZATION  2011  . ESOPHAGEAL DILATION  11/20/2016   Procedure: ESOPHAGEAL DILATION;  Surgeon: [name], MD;  Location: [facility];  Service: Endoscopy;;  . ESOPHAGEAL DILATION N/A 05-28-2018   Procedure: ESOPHAGEAL DILATION;  Surgeon: [name], MD;  Location: [facility];  Service: Endoscopy;  Laterality: N/A;  . ESOPHAGEAL DILATION  02/15/2018   Procedure: ESOPHAGEAL DILATION;  Surgeon: [name], MD;  Location[facility];  Service: Endoscopy;;  . ESOPHAGEAL DILATION N/A 03/19/2018   Procedure: ESOPHAGEAL DILATION;  Surgeon: [name], MD;  Location: [facility];  Service: Endoscopy;  Laterality: N/A;  . ESOPHAGOGASTRODUODENOSCOPY (EGD) WITH PROPOFOL N/A 11/20/2016   Procedure: ESOPHAGOGASTRODUODENOSCOPY (EGD) WITH PROPOFOL;  Surgeon: [name], MD;  Location: [facility];  Service: Endoscopy;  Laterality: N/A;  . ESOPHAGOGASTRODUODENOSCOPY (EGD) WITH PROPOFOL N/A 05-28-2018   Procedure: ESOPHAGOGASTRODUODENOSCOPY (EGD) WITH PROPOFOL;  Surgeon: [name], MD;  Location: [facility];  Service: Endoscopy;  Laterality: N/A;  . ESOPHAGOGASTRODUODENOSCOPY (EGD) WITH PROPOFOL N/A 02/15/2018   Procedure: ESOPHAGOGASTRODUODENOSCOPY (EGD) WITH PROPOFOL;  Surgeon: [name], MD;  Location: [facility];  Service: Endoscopy;  Laterality: N/A;  . ESOPHAGOGASTRODUODENOSCOPY (EGD) WITH PROPOFOL N/A 03/19/2018   Procedure: ESOPHAGOGASTRODUODENOSCOPY (EGD) WITH PROPOFOL;  Surgeon: [name], MD;  Location: [facility];  Service: Endoscopy;  Laterality: N/A;     A IV Location/Drains/Wounds Patient Lines/Drains/Airways Status   Active Line/Drains/Airways    Name:   Placement date:   Placement time:   Site:   Days:   Peripheral IV 12/20/18 Right Wrist   12/20/18    1902    Wrist   less than 1   Peripheral IV 12/20/18 Left  Forearm   12/20/18    1933    Forearm   less than 1   Incision (Closed) 11/20/16 Lip Other (Comment)   11/20/16    1042     760   Incision (Closed) 01/21/18 Lip Other (Comment)   01/21/18    1145     333   Incision (Closed) 02/15/18 Lip Other (Comment)   02/15/18    1050     308   Incision (Closed) 03/19/18 Throat Other (Comment)   03/19/18    1007     276          Intake/Output Last 24 hours  Intake/Output Summary (Last 24 hours) at 12/20/2018 2300 Last data filed at 12/20/2018 2238 Gross per 24 hour  Intake 98.4 ml  Output -  Net 98.4 ml    Labs/Imaging Results for orders placed or performed during the hospital encounter of 12/20/18 (from the past 48 hour(s))  CBC with Differential     Status: Abnormal   Collection Time: 12/20/18  7:25 PM  Result Value Ref Range   WBC 18.5 (H) 4.0 - 10.5 K/uL   RBC 3.37 (L) 3.87 - 5.11 MIL/uL   Hemoglobin 9.5 (L) 12.0 - 15.0 g/dL   HCT 16.1 (L) 09.6 - 04.5 %   MCV 89.6 80.0 - 100.0 fL   MCH 28.2 26.0 - 34.0 pg   MCHC 31.5 30.0 - 36.0 g/dL   RDW 40.9 81.1 - 91.4 %   Platelets 385 150 - 400 K/uL   nRBC 0.0 0.0 - 0.2 %   Neutrophils Relative % 87 %   Neutro Abs 16.0 (H) 1.7 - 7.7 K/uL   Lymphocytes Relative 7 %   Lymphs Abs 1.3 0.7 - 4.0 K/uL   Monocytes Relative 5 %   Monocytes Absolute 1.0 0.1 - 1.0 K/uL   Eosinophils Relative 0 %   Eosinophils Absolute 0.1 0.0 - 0.5 K/uL   Basophils Relative 0 %   Basophils Absolute 0.1 0.0 - 0.1 K/uL   Immature Granulocytes 1 %   Abs Immature Granulocytes 0.13 (H) 0.00 - 0.07 K/uL    Comment: Performed at [facility], [address] [place], [place] [address]  Comprehensive metabolic panel     Status: Abnormal   Collection Time: 12/20/18  7:25 PM  Result Value Ref Range   Sodium 131 (L) 135 - 145 mmol/L   Potassium 4.5 3.5 - 5.1 mmol/L   Chloride 92 (L) 98 - 111 mmol/L   CO2 27 22 - 32 mmol/L   Glucose, Bld 103 (H) 70 - 99 mg/dL   BUN 42 (H) 8 - 23 mg/dL   Creatinine, Ser 6.21 (H)  0.44 - 1.00 mg/dL   Calcium 8.3 (L) 8.9 - 10.3 mg/dL   Total Protein 6.4 (L) 6.5 - 8.1 g/dL   Albumin 3.1 (L) 3.5 - 5.0 g/dL   AST 17 15 - 41 U/L   ALT 12 0 - 44 U/L   Alkaline Phosphatase 88 38 - 126 U/L   Total Bilirubin 0.6 0.3 - 1.2 mg/dL  GFR calc non Af Amer 31 (L) >60 mL/min   GFR calc Af Amer 35 (L) >60 mL/min   Anion gap 12 5 - 15    Comment: Performed at [facility], [address] [place], [place] [address]  Lactic acid, plasma     Status: None   Collection Time: 12/20/18  7:25 PM  Result Value Ref Range   Lactic Acid, Venous 1.1 0.5 - 1.9 mmol/L    Comment: Performed at [facility], [address] [place], [place] [address]  Troponin I - Once     Status: Abnormal   Collection Time: 12/20/18  7:25 PM  Result Value Ref Range   Troponin I 0.03 (HH) <0.03 ng/mL    Comment: CRITICAL RESULT CALLED TO, READ BACK BY AND VERIFIED WITH ANDREA BRYANT AT 2023 12/20/2018 SMA Performed at [facility], [address], [place], [place] [address]    Dg Chest Portable 1 View  Result Date: 12/20/2018 CLINICAL DATA:  Initial evaluation for acute altered mental status. EXAM: PORTABLE CHEST 1 VIEW COMPARISON:  Prior radiograph from 11/29/2018. FINDINGS: Cardiac and mediastinal silhouettes are stable in size and contour, and remain within normal limits. Lungs are hyperinflated with changes related to COPD. No focal infiltrates. No edema or effusion. No pneumothorax. No acute osseous finding. IMPRESSION: COPD. No other active cardiopulmonary disease. Electronically Signed   By: Rise Mu M.D.   On: 12/20/2018 20:17    Pending Labs Unresulted Labs (From admission, onward)    Start     Ordered   12/27/18 0500  Creatinine, serum  (enoxaparin (LOVENOX)    CrCl < 30 ml/min)  Weekly,   STAT    Comments:  while on enoxaparin therapy.    12/20/18 2218   12/21/18 0500  Procalcitonin  Daily,   STAT     12/20/18 2209   12/21/18 0500  Basic metabolic  panel  Tomorrow morning,   STAT     12/20/18 2218   12/21/18 0500  CBC  Tomorrow morning,   STAT     12/20/18 2218   12/20/18 2227  Urinalysis, Routine w reflex microscopic  Once,   STAT     12/20/18 2227   12/20/18 2227  Urine Culture  Once,   STAT     12/20/18 2227   12/20/18 2211  CBC  (enoxaparin (LOVENOX)    CrCl < 30 ml/min)  Once,   STAT    Comments:  Baseline for enoxaparin therapy IF NOT ALREADY DRAWN.  Notify MD if PLT < 100 K.    12/20/18 2218   12/20/18 2211  Creatinine, serum  (enoxaparin (LOVENOX)    CrCl < 30 ml/min)  Once,   STAT    Comments:  Baseline for enoxaparin therapy IF NOT ALREADY DRAWN.    12/20/18 2218   12/20/18 2210  Procalcitonin - Baseline  ONCE - STAT,   STAT     12/20/18 2209   12/20/18 1924  Culture, blood (Routine x 2)  BLOOD CULTURE X 2,   STAT     12/20/18 1924   12/20/18 1844  Urinalysis, Complete w Microscopic  (ALOC)  ONCE - STAT,   STAT     12/20/18 1843   12/20/18 1843  Lactic acid, plasma  (ALOC)  STAT Now then every 3 hours,   STAT     12/20/18 1843          Vitals/Pain Today's Vitals   12/20/18 2015 12/20/18 2030 12/20/18 2145 12/20/18  2200  BP: (!) 83/46 96/65 (!) 105/56 (!) 90/55  Pulse: (!) 101 (!) 106 100 98  Resp: 18 20 17 17   Temp:      TempSrc:      SpO2: 99% 97% 100% 99%  Weight:      Height:      PainSc:        Isolation Precautions No active isolations  Medications Medications  enoxaparin (LOVENOX) injection 30 mg (has no administration in time range)  sodium chloride flush (NS) 0.9 % injection 3 mL (has no administration in time range)  sodium chloride flush (NS) 0.9 % injection 3 mL (has no administration in time range)  0.9 %  sodium chloride infusion (has no administration in time range)  acetaminophen (TYLENOL) tablet 650 mg (has no administration in time range)    Or  acetaminophen (TYLENOL) suppository 650 mg (has no administration in time range)  ondansetron (ZOFRAN) tablet 4 mg (has no  administration in time range)    Or  ondansetron (ZOFRAN) injection 4 mg (has no administration in time range)  cefTRIAXone (ROCEPHIN) 1 g in sodium chloride 0.9 % 100 mL IVPB (has no administration in time range)  cefTRIAXone (ROCEPHIN) 1 g in sodium chloride 0.9 % 100 mL IVPB (0 g Intravenous Stopped 12/20/18 2238)    Mobility non-ambulatory High fall risk   Focused Assessments Cardiac Assessment Handoff:    Lab Results  Component Value Date   CKTOTAL 36 07/17/2012   CKMB 1.5 07/17/2012   TROPONINI 0.03 (HH) 12/20/2018   No results found for: DDIMER Does the Patient currently have chest pain? No     R Recommendations: See Admitting Provider Note  Report given to:  Morrie SheldonAshley RN on 2A  Additional Notes:

## 2018-12-20 NOTE — ED Provider Notes (Signed)
KG read interpreted by me shows sinus tachycardia rate of 103 left axis nonspecific ST-T wave changes   Arnaldo Natal, MD 12/20/18 1920

## 2018-12-20 NOTE — H&P (Signed)
[employer] [facility] [place] at [facility]   PATIENT NAME: Carolyn Lewis    MR#:  [id number]  DATE OF BIRTH:  [birth year]  DATE OF ADMISSION:  12/20/2018  PRIMARY CARE PHYSICIAN: Corky Downs, MD   REQUESTING/REFERRING PHYSICIAN: Dorothea Glassman, MD  CHIEF COMPLAINT:   Chief Complaint  Patient presents with  . Altered Mental Status    HISTORY OF PRESENT ILLNESS:  Carolyn Lewis  is a 78 y.o. female with a known history of chronic debility on hospice care at home.  She has a history of COPD, coronary artery disease, stage III renal failure.  She presented to the emergency room via EMS services from home.  According to documentation, patient had experience several episodes of nausea and vomiting over the last 2 days as well as 2 episodes of epistaxis.  Family informed EMS of bloody emesis as well.  On EMS arrival to her home, it was documented she was hypoxic with oxygen saturation in the 80s on 4 L oxygen per nasal cannula.  She was placed on a nonrebreather with oxygen saturations increased to the upper 90s.  Family reported patient having altered mental status since the a.m. of 12/20/2018.  Patient is currently able to answer simple questions.  However she is unable to tell me why she is here.  She is short of breath with minimal exertion such as conversation.  She denies experiencing abdominal pain.  She denies chest pain.  Patient is bedbound with edema, ecchymosis, and erythema of bilateral feet.  Chest x-ray on arrival shows no acute pulmonary disease.  There is leukocytosis present with WBC 18.5.  Troponin is 0.03.  Creatinine is 1.62 with BUN 42.  She was started on IV antibiotic therapy with Rocephin in the emergency room.  She has been admitted to the hospitalist service for further management.  PAST MEDICAL HISTORY:   Past Medical History:  Diagnosis Date  . AAA (abdominal aortic aneurysm) (HCC)   . Acute MI [facility])    cath with 2 stents placed  . Anxiety   . Arthritis    . CKD stage G3b/A3, GFR 30 - 44 and albumin creatinine ratio >300 mg/g   . COPD (chronic obstructive pulmonary disease) (HCC)   . Dyspnea   . GERD (gastroesophageal reflux disease)   . Headache    migraines  . Hypertension   . Presence of stent in artery 01/2010   2 cardiac stents  . Wears dentures    full upper and lower    PAST SURGICAL HISTORY:   Past Surgical History:  Procedure Laterality Date  . ABDOMINAL HYSTERECTOMY    . CARDIAC CATHETERIZATION  2011  . ESOPHAGEAL DILATION  11/20/2016   Procedure: ESOPHAGEAL DILATION;  Surgeon: [name], MD;  Location: [facility];  Service: Endoscopy;;  . ESOPHAGEAL DILATION N/A 08/19/202019   Procedure: ESOPHAGEAL DILATION;  Surgeon: [name], MD;  Location: [facility];  Service: Endoscopy;  Laterality: N/A;  . ESOPHAGEAL DILATION  02/15/2018   Procedure: ESOPHAGEAL DILATION;  Surgeon: [name], MD;  Location: [facility];  Service: Endoscopy;;  . ESOPHAGEAL DILATION N/A 03/19/2018   Procedure: ESOPHAGEAL DILATION;  Surgeon: [name], MD;  Location: [facility];  Service: Endoscopy;  Laterality: N/A;  . ESOPHAGOGASTRODUODENOSCOPY (EGD) WITH PROPOFOL N/A 11/20/2016   Procedure: ESOPHAGOGASTRODUODENOSCOPY (EGD) WITH PROPOFOL;  Surgeon: [name], MD;  Location: [facility];  Service: Endoscopy;  Laterality: N/A;  . ESOPHAGOGASTRODUODENOSCOPY (EGD) WITH PROPOFOL N/A 08/19/202019   Procedure:  ESOPHAGOGASTRODUODENOSCOPY (EGD) WITH PROPOFOL;  Surgeon: [name], MD;  Location: [facility];  Service: Endoscopy;  Laterality: N/A;  . ESOPHAGOGASTRODUODENOSCOPY (EGD) WITH PROPOFOL N/A 02/15/2018   Procedure: ESOPHAGOGASTRODUODENOSCOPY (EGD) WITH PROPOFOL;  Surgeon: [name], MD;  Location: [facility];  Service: Endoscopy;  Laterality: N/A;  . ESOPHAGOGASTRODUODENOSCOPY (EGD) WITH PROPOFOL N/A 03/19/2018   Procedure: ESOPHAGOGASTRODUODENOSCOPY (EGD) WITH PROPOFOL;  Surgeon: [name], MD;  Location: [facility];  Service: Endoscopy;  Laterality: N/A;    SOCIAL HISTORY:   Social History   Tobacco Use  . Smoking status: Former Smoker    Packs/day: 1.00    Years: 15.00    Pack years: 15.00    Types: Cigarettes    Last attempt to quit: 2015    Years since quitting: 5.3  . Smokeless tobacco: Never Used  Substance Use Topics  . Alcohol use: No    FAMILY HISTORY:  History reviewed. No pertinent family history.  DRUG ALLERGIES:   Allergies  Allergen Reactions  . Sulfa Antibiotics Rash    REVIEW OF SYSTEMS:   Review of Systems  Constitutional: Positive for fever and malaise/fatigue. Negative for chills.  HENT: Positive for nosebleeds. Negative for congestion and sinus pain.   Eyes: Negative for blurred vision and double vision.  Respiratory: Positive for cough and shortness of breath.   Cardiovascular: Positive for leg swelling. Negative for chest pain and palpitations.  Gastrointestinal: Positive for nausea and vomiting. Negative for abdominal pain and diarrhea.  Genitourinary: Negative for dysuria and hematuria.  Musculoskeletal: Positive for back pain and myalgias. Negative for neck pain.  Skin: Negative for itching and rash.  Neurological: Positive for weakness.  Endo/Heme/Allergies: Bruises/bleeds easily.    MEDICATIONS AT HOME:   Prior to Admission medications   Medication Sig Start Date End Date Taking? Authorizing Provider  amitriptyline (ELAVIL) 25 MG tablet Take 25 mg by mouth at bedtime.   Yes [provider]  aspirin EC 81 MG tablet Take 1 tablet (81 mg total) by mouth daily. 08/06/18 08/06/19 Yes Sharyn Creamer, MD  atorvastatin (LIPITOR) 40 MG tablet Take 40 mg by mouth daily.   Yes [provider]  clopidogrel (PLAVIX) 75 MG tablet Take 75 mg by mouth daily.   Yes [provider]  diazepam (VALIUM) 2 MG tablet Take 2 mg by mouth 2 (two) times daily.   Yes [provider]  enalapril  (VASOTEC) 2.5 MG tablet Take 2.5 mg by mouth daily.   Yes [provider]  furosemide (LASIX) 20 MG tablet Take 20 mg by mouth daily.    Yes [provider]  hyoscyamine (LEVBID) 0.375 MG 12 hr tablet Take 0.375 mg by mouth 2 (two) times daily.   Yes [provider]  Ipratropium-Albuterol (COMBIVENT RESPIMAT) 20-100 MCG/ACT AERS respimat Inhale 1 puff into the lungs 3 (three) times daily as needed for wheezing or shortness of breath.   Yes [provider]  levalbuterol (XOPENEX) 1.25 MG/3ML nebulizer solution Take 1.25 mg by nebulization every 4 (four) hours as needed for wheezing or shortness of breath.   Yes [provider]  metoprolol (LOPRESSOR) 50 MG tablet Take 1 tablet (50 mg total) by mouth 2 (two) times daily. 02/03/15  Yes Mody, Patricia Pesa, MD  nitroGLYCERIN (NITROSTAT) 0.4 MG SL tablet Place 0.4 mg under the tongue every 5 (five) minutes as needed for chest pain.   Yes [provider]  pantoprazole (PROTONIX) 40 MG tablet Take 1 tablet (40 mg total) by mouth  2 (two) times daily. 03/19/18  Yes [name], MD  predniSONE (DELTASONE) 20 MG tablet Take 2 tablets (40 mg total) by mouth daily. Patient taking differently: Take 20 mg by mouth 2 (two) times daily with a meal.  08/06/18  Yes Sharyn Creamer, MD  promethazine (PHENERGAN) 25 MG tablet Take 25 mg by mouth every 12 (twelve) hours as needed.  10/30/18  Yes [provider]  tiZANidine (ZANAFLEX) 4 MG tablet Take 4 mg by mouth 2 (two) times daily. 09/17/18  Yes [provider]  traMADol (ULTRAM) 50 MG tablet Take 50 mg by mouth 2 (two) times daily.   Yes [provider]  amLODipine (NORVASC) 5 MG tablet Take 5 mg by mouth daily.    [provider]  azithromycin (ZITHROMAX Z-PAK) 250 MG tablet Take 2 tablets (500 mg) on  Day 1,  followed by 1 tablet (250 mg) once daily on Days 2 through 5. Patient not taking: Reported on 12/20/2018 11/29/18   Emily Filbert, MD   famotidine (PEPCID) 20 MG tablet Take 20 mg by mouth daily.    [provider]  predniSONE (STERAPRED UNI-PAK 21 TAB) 10 MG (21) TBPK tablet Dispense steroid taper pack as directed Patient not taking: Reported on 12/20/2018 11/29/18   Emily Filbert, MD      VITAL SIGNS:  Blood pressure (!) 90/55, pulse 98, temperature 99.5 F (37.5 C), temperature source Oral, resp. rate 17, height  (1.702 m), weight 56.7 kg, SpO2 99 %.  PHYSICAL EXAMINATION:  Physical Exam Constitutional:      Appearance: She is ill-appearing.  HENT:     Head: Normocephalic and atraumatic.     Nose: Nose normal. No congestion or rhinorrhea.     Mouth/Throat:     Mouth: Mucous membranes are moist.     Pharynx: Oropharynx is clear.  Eyes:     General: No scleral icterus.    Conjunctiva/sclera: Conjunctivae normal.     Pupils: Pupils are equal, round, and reactive to light.  Neck:     Musculoskeletal: Normal range of motion and neck supple.  Cardiovascular:     Rate and Rhythm: Normal rate and regular rhythm.     Pulses: Normal pulses.     Heart sounds: Normal heart sounds. No murmur.  Pulmonary:     Breath sounds: Rhonchi and rales present.  Abdominal:     General: Abdomen is flat. Bowel sounds are normal. There is no distension.     Palpations: Abdomen is soft. There is no mass.     Tenderness: There is no abdominal tenderness.  Musculoskeletal:        General: Tenderness present.     Right lower leg: Edema present.     Left lower leg: Edema present.  Skin:    General: Skin is warm and dry.     Capillary Refill: Capillary refill takes less than 2 seconds.     Findings: Bruising and erythema present.  Neurological:     Mental Status: She is alert. She is disoriented.     Cranial Nerves: No cranial nerve deficit.     Motor: Weakness present.     Gait: Gait abnormal.      LABORATORY PANEL:   CBC Recent Labs  Lab 12/20/18 1925  WBC 18.5*  HGB 9.5*  HCT 30.2*  PLT 385    ------------------------------------------------------------------------------------------------------------------  Chemistries  Recent Labs  Lab 12/20/18 1925  NA 131*  K 4.5  CL 92*  CO2 27  GLUCOSE 103*  BUN 42*  CREATININE 1.62*  CALCIUM 8.3*  AST 17  ALT 12  ALKPHOS 88  BILITOT 0.6   ------------------------------------------------------------------------------------------------------------------  Cardiac Enzymes Recent Labs  Lab 12/20/18 1925  TROPONINI 0.03*   ------------------------------------------------------------------------------------------------------------------  RADIOLOGY:  Dg Chest Portable 1 View  Result Date: 12/20/2018 CLINICAL DATA:  Initial evaluation for acute altered mental status. EXAM: PORTABLE CHEST 1 VIEW COMPARISON:  Prior radiograph from 11/29/2018. FINDINGS: Cardiac and mediastinal silhouettes are stable in size and contour, and remain within normal limits. Lungs are hyperinflated with changes related to COPD. No focal infiltrates. No edema or effusion. No pneumothorax. No acute osseous finding. IMPRESSION: COPD. No other active cardiopulmonary disease. Electronically Signed   By: Rise MuBenjamin  McClintock M.D.   On: 12/20/2018 20:17      IMPRESSION AND PLAN:   1. altered mental status - Patient is currently oriented to person and place. - This may have been secondary to hypoxia.  Patient with oxygen saturation 95 to 99% on nonrebreather currently.  2.  Acute hypoxic respiratory failure - Chest x-ray negative - Improved with nonrebreather - Patient is on oxygen at 4 L per nasal cannula at home.  She is on hospice care prior to this visit.  Ideally she will be able to       transition back to nasal cannula at 4 L/min as this is her baseline therapy. - Patient's husband was consulted regarding palliative care consult given patient's poor prognosis.  Husband is in agreement with     consultation.  Therefore, consult requested.  CODE STATUS  discussed again with patient's husband.  At this time he wishes for    her to remain full CODE STATUS.  3.  Cellulitis of bilateral lower extremities - We will continue IV Rocephin as patient received initial dose in the emergency room prior to admission.  - Urinalysis with culture as well as blood cultures are pending.  Will adjust therapy based on results.   --Will repeat CBC in the a.m.  4.  Leukocytosis - IV antibiotic therapy started with Rocephin. - Review CBC results in the a.m. - Possibly secondary to bilateral lower extremity cellulitis  -urinalysis and culture are pending  5. DVT and PPI prophylaxis have been initiated   All the records are reviewed and case discussed with ED provider. The plan of care was discussed in details with the patient (and family). I answered all questions. The patient agreed to proceed with the above mentioned plan. Further management will depend upon hospital course.   CODE STATUS: Full code  TOTAL TIME TAKING CARE OF THIS PATIENT: 45 minutes.    Milas Kocherngela H Junnie Loschiavo CRNP on 12/20/2018 at 10:33 PM  Pager - [phone]  After 6pm go to [email or web address] - Social research officer, governmentpassword EPAS [facility]  [facility] [place] [facility]  Office  [phone]  CC: Primary care physician; Corky DownsMasoud, Javed, MD   Note: This dictation was prepared with Dragon dictation along with smaller phrase technology. Any transcriptional errors that result from this process are unintentional.

## 2018-12-20 NOTE — Progress Notes (Signed)
Family Meeting Note  Advance Directive:yes  Today a meeting took place with the spouse.  Patient is unable to participate due [id number] capacity AMS   The following clinical team members were present during this meeting:MD  The following were discussed:Patient's diagnosis: 68 y f with severe COPD and multiple other medical problems followed by [facility] brought in to ED for AMS and some concern for infection (pneumonia vs skin infection). I had long conversation with husband who says she is full code and amenable to have further discussion on code status tomorrow with PC/Hospice team.  Past Medical History:  Diagnosis Date  . AAA (abdominal aortic aneurysm) (HCC)   . Acute MI [facility])    cath with 2 stents placed  . Anxiety   . Arthritis   . CKD stage G3b/A3, GFR 30 - 44 and albumin creatinine ratio >300 mg/g   . COPD (chronic obstructive pulmonary disease) (HCC)   . Dyspnea   . GERD (gastroesophageal reflux disease)   . Headache    migraines  . Hypertension   . Presence of stent in artery 01/2010   2 cardiac stents  . Wears dentures    full upper and lower   Patient's progosis: < 6 months and Goals for treatment: Full Code  Additional follow-up to be provided: Palliative care c/s - address code status and set expectation for Hospice. May want to have [facility] come and talk with her and family. Husband would like for her to come back home with Hospice.  Time spent during discussion:16 mins  Delfino Lovett, MD

## 2018-12-20 NOTE — ED Triage Notes (Signed)
Pt arrives to ED from home via [facility] with c/c of altered mental status. EMS reports Pt is currently on hospice but no DNR has been filed at this time. Family noticed AMS this morning and reported several episodes of vomiting over the last 2 days. Transport vitals reported as 130/86, p 70, NSR, temp 96.1 oral. CBG 130. Pt responds to voice and is oriented to self, NAD.

## 2018-12-20 NOTE — ED Provider Notes (Signed)
[facility] Emergency Department Provider Note  ____________________________________________   First MD Initiated Contact with Patient 12/20/18 1845     (approximate)  I have reviewed the triage vital signs and the nursing notes.   HISTORY  Chief Complaint Altered Mental Status    HPI Carolyn Lewis is a 78 y.o. female presents to the emergency department via EMS.  She has altered mental status.  Patient is currently on hospice care but does not have a DNR file.  Family noticed that she had altered level consciousness this morning.  She has had several episodes of vomiting over the last 2 days and 2 episodes of nosebleeds.  They did notice blood when she vomited.  Patient is not answering questions.  EMS personnel state that her oxygen levels were in the 80s and she is on 4 L of oxygen.  When they put her on a nonrebreather her oxygen level is increased to 99.    Past Medical History:  Diagnosis Date  . AAA (abdominal aortic aneurysm) (HCC)   . Acute MI [facility])    cath with 2 stents placed  . Anxiety   . Arthritis   . CKD stage G3b/A3, GFR 30 - 44 and albumin creatinine ratio >300 mg/g   . COPD (chronic obstructive pulmonary disease) (HCC)   . Dyspnea   . GERD (gastroesophageal reflux disease)   . Headache    migraines  . Hypertension   . Presence of stent in artery 01/2010   2 cardiac stents  . Wears dentures    full upper and lower    Patient Active Problem List   Diagnosis Date Noted  . 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 AAA (abdominal aortic aneurysm) without rupture (HCC) 08/10/2018  . Stricture esophagus   . Dysphagia   . Problems with swallowing and mastication   . Stricture and stenosis of esophagus   . Gastritis without bleeding   . COPD exacerbation (HCC) 06/16/2016  . Anxiety 06/16/2016  . GERD (gastroesophageal reflux disease) 06/16/2016  . HTN (hypertension) 06/16/2016  . CKD (chronic kidney disease), stage  III (HCC) 06/16/2016  . Fever 02/06/2015    Past Surgical History:  Procedure Laterality Date  . ABDOMINAL HYSTERECTOMY    . CARDIAC CATHETERIZATION  2011  . ESOPHAGEAL DILATION  11/20/2016   Procedure: ESOPHAGEAL DILATION;  Surgeon: [name], MD;  Location: [facility];  Service: Endoscopy;;  . ESOPHAGEAL DILATION N/A 01/21/2018   Procedure: ESOPHAGEAL DILATION;  Surgeon: [name], MD;  Location: [facility];  Service: Endoscopy;  Laterality: N/A;  . ESOPHAGEAL DILATION  02/15/2018   Procedure: ESOPHAGEAL DILATION;  Surgeon: [name], MD;  Location: [facility];  Service: Endoscopy;;  . ESOPHAGEAL DILATION N/A 03/19/2018   Procedure: ESOPHAGEAL DILATION;  Surgeon: [name], MD;  Location: [facility];  Service: Endoscopy;  Laterality: N/A;  . ESOPHAGOGASTRODUODENOSCOPY (EGD) WITH PROPOFOL N/A 11/20/2016   Procedure: ESOPHAGOGASTRODUODENOSCOPY (EGD) WITH PROPOFOL;  Surgeon: [name], MD;  Location: [facility];  Service: Endoscopy;  Laterality: N/A;  . ESOPHAGOGASTRODUODENOSCOPY (EGD) WITH PROPOFOL N/A 01/21/2018   Procedure: ESOPHAGOGASTRODUODENOSCOPY (EGD) WITH PROPOFOL;  Surgeon: [name], MD;  Location: [facility];  Service: Endoscopy;  Laterality: N/A;  . ESOPHAGOGASTRODUODENOSCOPY (EGD) WITH PROPOFOL N/A 02/15/2018   Procedure: ESOPHAGOGASTRODUODENOSCOPY (EGD) WITH PROPOFOL;  Surgeon: [name], MD;  Location: [facility];  Service: Endoscopy;  Laterality: N/A;  . ESOPHAGOGASTRODUODENOSCOPY (EGD) WITH PROPOFOL N/A 03/19/2018   Procedure: ESOPHAGOGASTRODUODENOSCOPY (EGD)  WITH PROPOFOL;  Surgeon: Midge MiniumWohl, Darren, MD;  [facility];  Service: Endoscopy;  Laterality: N/A;    Prior to Admission medications   Medication Sig Start Date End Date Taking? Authorizing Provider  amitriptyline (ELAVIL) 25 MG tablet Take 25 mg by mouth at bedtime.    [provider]  amLODipine (NORVASC) 5 MG tablet  Take 5 mg by mouth daily.    [provider]  aspirin EC 81 MG tablet Take 1 tablet (81 mg total) by mouth daily. 08/06/18 08/06/19  Sharyn CreamerQuale, Mark, MD  atorvastatin (LIPITOR) 40 MG tablet Take 40 mg by mouth daily.    [provider]  azithromycin (ZITHROMAX Z-PAK) 250 MG tablet Take 2 tablets (500 mg) on  Day 1,  followed by 1 tablet (250 mg) once daily on Days 2 through 5. 11/29/18   Emily FilbertWilliams, Jonathan E, MD  clopidogrel (PLAVIX) 75 MG tablet Take 75 mg by mouth daily.    [provider]  diazepam (VALIUM) 2 MG tablet Take 2 mg by mouth 2 (two) times daily.    [provider]  enalapril (VASOTEC) 2.5 MG tablet Take 2.5 mg by mouth daily.    [provider]  famotidine (PEPCID) 20 MG tablet Take 20 mg by mouth daily.    [provider]  furosemide (LASIX) 20 MG tablet Take 20 mg by mouth daily. Patient only takes Monday, Wednesday, and Friday.     [provider]  Ipratropium-Albuterol (COMBIVENT RESPIMAT) 20-100 MCG/ACT AERS respimat Inhale 1 puff into the lungs 3 (three) times daily as needed for wheezing or shortness of breath.    [provider]  levalbuterol Pauline Aus(XOPENEX) 1.25 MG/3ML nebulizer solution Take 1.25 mg by nebulization every 4 (four) hours as needed for wheezing or shortness of breath.    [provider]  metoprolol (LOPRESSOR) 50 MG tablet Take 1 tablet (50 mg total) by mouth 2 (two) times daily. 02/03/15   Adrian SaranMody, Sital, MD  nitroGLYCERIN (NITROSTAT) 0.4 MG SL tablet Place 0.4 mg under the tongue every 5 (five) minutes as needed for chest pain.    [provider]  pantoprazole (PROTONIX) 40 MG tablet Take 1 tablet (40 mg total) by mouth 2 (two) times daily. 03/19/18   Midge MiniumWohl, Darren, MD  predniSONE (DELTASONE) 20 MG tablet Take 2 tablets (40 mg total) by mouth daily. 08/06/18   Sharyn CreamerQuale, Mark, MD  predniSONE (STERAPRED UNI-PAK 21 TAB) 10 MG (21) TBPK tablet Dispense steroid taper pack as directed 11/29/18    Emily FilbertWilliams, Jonathan E, MD  traMADol (ULTRAM) 50 MG tablet Take 50 mg by mouth 2 (two) times daily.    [provider]    Allergies Sulfa antibiotics  History reviewed. No pertinent family history.  Social History Social History   Tobacco Use  . Smoking status: Former Smoker    Packs/day: 1.00    Years: 15.00    Pack years: 15.00    Types: Cigarettes    Last attempt to quit: 2015    Years since quitting: 5.3  . Smokeless tobacco: Never Used  Substance Use Topics  . Alcohol use: No  . Drug use: No    Review of Systems  Constitutional: No fever/chills, altered mental status Eyes: No visual changes. ENT: No sore throat. Respiratory: Denies cough Genitourinary: Negative for dysuria. Musculoskeletal: Negative for back pain. Skin: Negative for rash.    ____________________________________________   PHYSICAL EXAM:  VITAL SIGNS: ED Triage Vitals  Enc Vitals Group     BP  12/20/18 1905 (!) 92/58     Pulse Rate 12/20/18 1905 (!) 102     Resp 12/20/18 1905 18     Temp 12/20/18 1912 99.5 F (37.5 C)     Temp Source 12/20/18 1912 Oral     SpO2 12/20/18 1905 99 %     Weight 12/20/18 1908 125 lb (56.7 kg)     Height 12/20/18 1908  (1.702 m)     Head Circumference --      Peak Flow --      Pain Score 12/20/18 1908 6     Pain Loc --      Pain Edu? --      Excl. in GC? --     Constitutional: Patient is in mild distress, difficulty breathing eyes: Conjunctivae are normal.  Head: Atraumatic. Nose: No congestion/rhinnorhea. Mouth/Throat: Mucous membranes are moist.   Neck:  supple no lymphadenopathy noted Cardiovascular: Normal rate, regular rhythm. Heart sounds are normal Respiratory: Increased respiratory effort, rhonchi noted in the left lower lung  abd: soft nontender bs normal all 4 quad GU: deferred Musculoskeletal: FROM all extremities, warm and well perfused Neurologic:  Normal speech and language.  Skin:  Skin is warm, dry and intact. No rash  noted. Psychiatric: Mood and affect are normal. Speech and behavior are normal.  ____________________________________________   LABS (all labs ordered are listed, but only abnormal results are displayed)  Labs Reviewed  CBC WITH DIFFERENTIAL/PLATELET - Abnormal; Notable for the following components:      Result Value   WBC 18.5 (*)    RBC 3.37 (*)    Hemoglobin 9.5 (*)    HCT 30.2 (*)    Neutro Abs 16.0 (*)    Abs Immature Granulocytes 0.13 (*)    All other components within normal limits  COMPREHENSIVE METABOLIC PANEL - Abnormal; Notable for the following components:   Sodium 131 (*)    Chloride 92 (*)    Glucose, Bld 103 (*)    BUN 42 (*)    Creatinine, Ser 1.62 (*)    Calcium 8.3 (*)    Total Protein 6.4 (*)    Albumin 3.1 (*)    GFR calc non Af Amer 31 (*)    GFR calc Af Amer 35 (*)    All other components within normal limits  TROPONIN I - Abnormal; Notable for the following components:   Troponin I 0.03 (*)    All other components within normal limits  CULTURE, BLOOD (ROUTINE X 2)  CULTURE, BLOOD (ROUTINE X 2)  LACTIC ACID, PLASMA  LACTIC ACID, PLASMA  URINALYSIS, COMPLETE (UACMP) WITH MICROSCOPIC  CBG MONITORING, ED   ____________________________________________   ____________________________________________  RADIOLOGY  Chest x-ray does not show any acute abnormality  ____________________________________________   PROCEDURES  Procedure(s) performed: EKG   Procedures    ____________________________________________   INITIAL IMPRESSION / ASSESSMENT AND PLAN / ED COURSE  Pertinent labs & imaging results that were available during my care of the patient were reviewed by me and considered in my medical decision making (see chart for details).   Patient is a 78 year old female presents via EMS with altered mental status.  Physical exam shows patient to have difficulty breathing.  She is not responding to our questions.  Lungs with rhonchi in the  left lower lung.  O2 sat increased to 99% with a nonrebreather on 4 L of O2.  Discussed the case with Dr. Darnelle Catalan.  Labs, EKG, chest x-ray were ordered    -----------------------------------------  8:42 PM on 12/20/2018 -----------------------------------------  Troponin elevated at 0.03, comprehensive metabolic panel shows decreased sodium at 131, BUN and creatinine are increased but appear to be her normal levels, CBC shows elevated WBC of 18.5 with decreased H&H, lactic acid is normal  Chest x-ray is normal  Discussed the case with Dr. Darnelle Catalan and care was transferred to him.  He stated for me to add Rocephin and admit for hypoxia  As part of my medical decision making, I reviewed the following data within the electronic MEDICAL RECORD NUMBER Nursing notes reviewed and incorporated, Labs reviewed the above, Old chart reviewed, Radiograph reviewed this x-ray is normal, Evaluated by EM attending Dr. Darnelle Catalan, Notes from prior ED visits and [place] Controlled Substance Database  ____________________________________________   FINAL CLINICAL IMPRESSION(S) / ED DIAGNOSES  Final diagnoses:  Altered level of consciousness  Hyponatremia  Hypoxia      NEW MEDICATIONS STARTED DURING THIS VISIT:  New Prescriptions   No medications on file     Note:  This document was prepared using Dragon voice recognition software and may include unintentional dictation errors.    Faythe Ghee, PA-C 12/20/18 2059    Arnaldo Natal, MD 12/20/18 [phone]

## 2018-12-21 DIAGNOSIS — L8990 Pressure ulcer of unspecified site, unspecified stage: Secondary | ICD-10-CM

## 2018-12-21 LAB — BASIC METABOLIC PANEL
Anion gap: 9 (ref 5–15)
BUN: 45 mg/dL — ABNORMAL HIGH (ref 8–23)
CO2: 27 mmol/L (ref 22–32)
Calcium: 8.4 mg/dL — ABNORMAL LOW (ref 8.9–10.3)
Chloride: 95 mmol/L — ABNORMAL LOW (ref 98–111)
Creatinine, Ser: 1.66 mg/dL — ABNORMAL HIGH (ref 0.44–1.00)
GFR calc Af Amer: 34 mL/min — ABNORMAL LOW (ref >60)
GFR calc non Af Amer: 30 mL/min — ABNORMAL LOW (ref >60)
Glucose, Bld: 85 mg/dL (ref 70–99)
Potassium: 4.4 mmol/L (ref 3.5–5.1)
Sodium: 131 mmol/L — ABNORMAL LOW (ref 135–145)

## 2018-12-21 LAB — CBC
HCT: 28.1 % — ABNORMAL LOW (ref 36.0–46.0)
Hemoglobin: 8.6 g/dL — ABNORMAL LOW (ref 12.0–15.0)
MCH: 27.7 pg (ref 26.0–34.0)
MCHC: 30.6 g/dL (ref 30.0–36.0)
MCV: 90.6 fL (ref 80.0–100.0)
Platelets: 336 10*3/uL (ref 150–400)
RBC: 3.1 MIL/uL — ABNORMAL LOW (ref 3.87–5.11)
RDW: 14.2 % (ref 11.5–15.5)
WBC: 14.9 10*3/uL — ABNORMAL HIGH (ref 4.0–10.5)
nRBC: 0 % (ref 0.0–0.2)

## 2018-12-21 LAB — PROCALCITONIN
Procalcitonin: 1.88 ng/mL
Procalcitonin: 1.96 ng/mL

## 2018-12-21 LAB — LACTIC ACID, PLASMA: Lactic Acid, Venous: 0.6 mmol/L (ref 0.5–1.9)

## 2018-12-21 MED ORDER — ORAL CARE MOUTH RINSE: 15.0000 mL | Freq: Two times a day (BID) | OROMUCOSAL | Status: DC

## 2018-12-21 MED ORDER — AMOXICILLIN-POT CLAVULANATE 875-125 MG PO TABS: 1.0000 | ORAL_TABLET | Freq: Two times a day (BID) | ORAL | 0 refills | Status: AC

## 2018-12-21 MED ORDER — METOPROLOL TARTRATE 50 MG PO TABS: 25.0000 mg | ORAL_TABLET | Freq: Two times a day (BID) | ORAL | 0 refills | Status: AC

## 2018-12-21 MED ORDER — AMOXICILLIN-POT CLAVULANATE 500-125 MG PO TABS
1.0000 | ORAL_TABLET | Freq: Two times a day (BID) | ORAL | Status: DC
  Filled 2018-12-21 (×2): qty 1

## 2018-12-21 MED ORDER — DIAZEPAM 2 MG PO TABS: 2.0000 mg | ORAL_TABLET | Freq: Two times a day (BID) | ORAL | 0 refills | Status: AC | PRN

## 2018-12-21 NOTE — TOC Transition Note (Addendum)
Transition of Care [facility]) - CM/SW Discharge Note   Patient Details  Name: Carolyn Lewis MRN: [id number] Date of Birth: [birth year]  [facility] CM/SW Contact:  Darleene Cleaver, LCSW Phone Number: 12/21/2018, 10:08 AM   Clinical Narrative:     Patient is a 78 year old female who is alert and oriented x3.  Patient is currently a [facility] patient [phone] per her daughter.  Patient has been on hospice at home for a few weeks.  Patient's daughter would like her to return back home today.  Patient's daughter would like to continue with the hospice agency.  Patient's daughter did not have any other questions or concerns and will be transporting patient herself.   Final next level of care: Home w Hospice Care Barriers to Discharge: No Barriers Identified   Patient Goals and CMS Choice Patient states their goals for this hospitalization and ongoing recovery are:: To return back home with hospice services.      Discharge Placement  Patient plans to return back to home with hospice, she is living with her husband and daughter.                 Discharge Plan and Services In-house Referral: Hospice / Palliative Care Discharge Planning Services: NA Post Acute Care Choice: Hospice                               Social Determinants of Health (SDOH) Interventions     Readmission Risk Interventions Readmission Risk Prevention Plan 12/21/2018  Transportation Screening Complete  PCP or Specialist Appt within 3-5 Days Complete  Social Work Consult for Recovery Care Planning/Counseling Complete  Palliative Care Screening Complete  Medication Review Oceanographer) Complete  Some recent data might be hidden

## 2018-12-21 NOTE — Progress Notes (Signed)
Palliative:  Consult received and chart reviewed. Discussed with Dr. Allena Katz - plans have been made for d/c home today with hospice and code status was changed to DNR. No additional needs.   Please call if we can be of assistance.  Gerlean Ren, DNP, AGNP-C Palliative Medicine Team Team Phone # [phone]  Pager # [phone]  NO CHARGE

## 2018-12-21 NOTE — Discharge Instructions (Signed)
Pt to be followed by Hospice at home (resume previous services)

## 2018-12-21 NOTE — Plan of Care (Signed)
  Problem: Coping: Goal: Level of anxiety will decrease Outcome: Progressing   Problem: Pain Managment: Goal: General experience of comfort will improve Outcome: Progressing   Problem: Safety: Goal: Ability to remain free from injury will improve Outcome: Progressing   

## 2018-12-21 NOTE — Discharge Summary (Signed)
[facility] [place] [facility]   PATIENT NAME: Tkeyah Razzaq    MR#:  [id number]  DATE OF BIRTH:  05-09-1941  DATE OF ADMISSION:  12/20/2018 ADMITTING PHYSICIAN: Delfino Lovett, MD  DATE OF DISCHARGE: 12/21/2018  PRIMARY CARE PHYSICIAN: Corky Downs, MD    ADMISSION DIAGNOSIS:  Hyponatremia [E87.1] Altered level of consciousness [R40.4] Hypoxia [R09.02]  DISCHARGE DIAGNOSIS:  Acute encephalopathy--now improved suspected due to hypoxia Acute on Chronic Hypoxic respiratory failure with end stage COPD Aspiration pneumonia  SECONDARY DIAGNOSIS:   Past Medical History:  Diagnosis Date  . AAA (abdominal aortic aneurysm) (HCC)   . Acute MI [facility])    cath with 2 stents placed  . Anxiety   . Arthritis   . CKD stage G3b/A3, GFR 30 - 44 and albumin creatinine ratio >300 mg/g   . COPD (chronic obstructive pulmonary disease) (HCC)   . Dyspnea   . GERD (gastroesophageal reflux disease)   . Headache    migraines  . Hypertension   . Presence of stent in artery 01/2010   2 cardiac stents  . Wears dentures    full upper and lower    HOSPITAL COURSE:  Darren Odekirk  is a 78 y.o. female with a known history of chronic debility on hospice care at home.  She has a history of COPD, coronary artery disease, stage III renal failure came to the ER  hypoxic with oxygen saturation in the 80s on 4 L oxygen per nasal cannula.  She was placed on a nonrebreather with oxygen saturations increased to the upper 90s.  Family reported patient having altered mental status since the a.m. of 12/20/2018.  Patient is currently able to answer simple questions. Pt had vomitted at home  1. altered mental status - Patient is currently oriented to person - This may have been secondary to hypoxia.  Patient with oxygen saturation 95 to 99% on [place] 4 liters currently. -she has end stage COPD on chronic oxygen, sterids andhospice at home  2.  Acute on chronic hypoxic respiratory failure - Chest  x-ray negative - Improved with nonrebreather - Patient is on oxygen at 4 L per nasal cannula at home.  She is on hospice care prior to this visit -change to po augmentin for Aspiration pneumonia with vomitting at home and AMS and elevated procalcitonin  3.  ?Cellulitis of bilateral lower extremities - pt has lot of bruises both upper and LE - Urinalysis with culture as well as blood cultures are pending.    4.  Leukocytosis - cont augmentin  5. DVT and PPI prophylaxis have been initiated  Spoke at length with dter Elease Hashimoto on the phone. She and pt's husband are eager to take her back home with hospice. Per dter pt is DNR now. Will respect family wishes and d/c pt to home with resumption of Hospice services. CONSULTS OBTAINED:    DRUG ALLERGIES:   Allergies  Allergen Reactions  . Sulfa Antibiotics Rash    DISCHARGE MEDICATIONS:   Allergies as of 12/21/2018      Reactions   Sulfa Antibiotics Rash      Medication List    STOP taking these medications   azithromycin 250 MG tablet Commonly known as:  Zithromax Z-Pak   enalapril 2.5 MG tablet Commonly known as:  VASOTEC   furosemide 20 MG tablet Commonly known as:  LASIX     TAKE these medications   amitriptyline 25 MG tablet Commonly known as:  ELAVIL Take 25  mg by mouth at bedtime.   amLODipine 5 MG tablet Commonly known as:  NORVASC Take 5 mg by mouth daily.   amoxicillin-clavulanate 875-125 MG tablet Commonly known as:  AUGMENTIN Take 1 tablet by mouth every 12 (twelve) hours.   aspirin EC 81 MG tablet Take 1 tablet (81 mg total) by mouth daily.   atorvastatin 40 MG tablet Commonly known as:  LIPITOR Take 40 mg by mouth daily.   clopidogrel 75 MG tablet Commonly known as:  PLAVIX Take 75 mg by mouth daily.   Combivent Respimat 20-100 MCG/ACT Aers respimat Generic drug:  Ipratropium-Albuterol Inhale 1 puff into the lungs 3 (three) times daily as needed for wheezing or shortness of breath.    diazepam 2 MG tablet Commonly known as:  VALIUM Take 1 tablet (2 mg total) by mouth every 12 (twelve) hours as needed for anxiety. What changed:    when to take this  reasons to take this   famotidine 20 MG tablet Commonly known as:  PEPCID Take 20 mg by mouth daily.   hyoscyamine 0.375 MG 12 hr tablet Commonly known as:  LEVBID Take 0.375 mg by mouth 2 (two) times daily.   levalbuterol 1.25 MG/3ML nebulizer solution Commonly known as:  XOPENEX Take 1.25 mg by nebulization every 4 (four) hours as needed for wheezing or shortness of breath.   metoprolol tartrate 50 MG tablet Commonly known as:  LOPRESSOR Take 0.5 tablets (25 mg total) by mouth 2 (two) times daily. What changed:  how much to take   nitroGLYCERIN 0.4 MG SL tablet Commonly known as:  NITROSTAT Place 0.4 mg under the tongue every 5 (five) minutes as needed for chest pain.   pantoprazole 40 MG tablet Commonly known as:  PROTONIX Take 1 tablet (40 mg total) by mouth 2 (two) times daily.   predniSONE 20 MG tablet Commonly known as:  Deltasone Take 2 tablets (40 mg total) by mouth daily. What changed:    how much to take  when to take this  Another medication with the same name was removed. Continue taking this medication, and follow the directions you see here.   promethazine 25 MG tablet Commonly known as:  PHENERGAN Take 25 mg by mouth every 12 (twelve) hours as needed.   tiZANidine 4 MG tablet Commonly known as:  ZANAFLEX Take 4 mg by mouth 2 (two) times daily.   traMADol 50 MG tablet Commonly known as:  ULTRAM Take 50 mg by mouth 2 (two) times daily.       If you experience worsening of your admission symptoms, develop shortness of breath, life threatening emergency, suicidal or homicidal thoughts you must seek medical attention immediately by calling 911 or calling your MD immediately  if symptoms less severe.  You Must read complete instructions/literature along with all the possible  adverse reactions/side effects for all the Medicines you take and that have been prescribed to you. Take any new Medicines after you have completely understood and accept all the possible adverse reactions/side effects.   Please note  You were cared for by a hospitalist during your hospital stay. If you have any questions about your discharge medications or the care you received while you were in the hospital after you are discharged, you can call the unit and asked to speak with the hospitalist on call if the hospitalist that took care of you is not available. Once you are discharged, your primary care physician will handle any further medical issues. Please note that  NO REFILLS for any discharge medications will be authorized once you are discharged, as it is imperative that you return to your primary care physician (or establish a relationship with a primary care physician if you do not have one) for your aftercare needs so that they can reassess your need for medications and monitor your lab values. Today   SUBJECTIVE   Awake. No new comlpaints  VITAL SIGNS:  Blood pressure (!) 127/52, pulse 89, temperature 97.9 F (36.6 C), resp. rate 16, height 5\' 7"  (1.702 m), weight 55.1 kg, SpO2 91 %.  I/O:    Intake/Output Summary (Last 24 hours) at 12/21/2018 0959 Last data filed at 12/21/2018 0440 Gross per 24 hour  Intake 101.4 ml  Output 0 ml  Net 101.4 ml    PHYSICAL EXAMINATION:  GENERAL:  78 y.o.-year-old patient lying in the bed with no acute distress. Chronically ill EYES: Pupils equal, round, reactive to light and accommodation. No scleral icterus. Extraocular muscles intact.  HEENT: Head atraumatic, normocephalic. Oropharynx and nasopharynx clear.  NECK:  Supple, no jugular venous distention. No thyroid enlargement, no tenderness.  LUNGSdistant breath sounds bilaterally, no wheezing, rales,rhonchi or crepitation. No use of accessory muscles of respiration.  CARDIOVASCULAR: S1, S2  normal. No murmurs, rubs, or gallops.  ABDOMEN: Soft, non-tender, non-distended. Bowel sounds present. No organomegaly or mass.  EXTREMITIES: ++ pedal edema,no cyanosis, or clubbing.  NEUROLOGIC: Cranial nerves II through XII are intact. Muscle strength 5/5 in all extremities. Sensation intact. Gait not checked.  PSYCHIATRIC: The patient is alert and awake SKIN: several bruises ++ chronic  DATA REVIEW:   CBC  Recent Labs  Lab 12/21/18 0450  WBC 14.9*  HGB 8.6*  HCT 28.1*  PLT 336    Chemistries  Recent Labs  Lab 12/20/18 1925 12/21/18 0450  NA 131* 131*  K 4.5 4.4  CL 92* 95*  CO2 27 27  GLUCOSE 103* 85  BUN 42* 45*  CREATININE 1.62* 1.66*  CALCIUM 8.3* 8.4*  AST 17  --   ALT 12  --   ALKPHOS 88  --   BILITOT 0.6  --     Microbiology Results   Recent Results (from the past 240 hour(s))  Culture, blood (Routine x 2)     Status: None (Preliminary result)   Collection Time: 12/20/18  7:26 PM  Result Value Ref Range Status   Specimen Description BLOOD LEFT ANTECUBITAL  Final   Special Requests   Final    BOTTLES DRAWN AEROBIC AND ANAEROBIC Blood Culture adequate volume   Culture   Final    NO GROWTH < 12 HOURS Performed at [facility], [address] [place], [place] [address]    Report Status PENDING  Incomplete  Culture, blood (Routine x 2)     Status: None (Preliminary result)   Collection Time: 12/20/18  7:31 PM  Result Value Ref Range Status   Specimen Description BLOOD BLOOD LEFT FOREARM  Final   Special Requests   Final    BOTTLES DRAWN AEROBIC AND ANAEROBIC Blood Culture adequate volume   Culture   Final    NO GROWTH < 12 HOURS Performed at [facility], [address] [place], [place] [address]    Report Status PENDING  Incomplete    RADIOLOGY:  Dg Chest Portable 1 View  Result Date: 12/20/2018 CLINICAL DATA:  Initial evaluation for acute altered mental status. EXAM: PORTABLE CHEST 1 VIEW COMPARISON:  Prior radiograph  from 11/29/2018. FINDINGS: Cardiac and mediastinal  silhouettes are stable in size and contour, and remain within normal limits. Lungs are hyperinflated with changes related to COPD. No focal infiltrates. No edema or effusion. No pneumothorax. No acute osseous finding. IMPRESSION: COPD. No other active cardiopulmonary disease. Electronically Signed   By: Rise Mu M.D.   On: 12/20/2018 20:17     CODE STATUS:     Code Status Orders  (From admission, onward)         Start     Ordered   12/21/18 0953  Do not attempt resuscitation (DNR)  Continuous    Question Answer Comment  In the event of cardiac or respiratory ARREST Do not call a "code blue"   In the event of cardiac or respiratory ARREST Do not perform Intubation, CPR, defibrillation or ACLS   In the event of cardiac or respiratory ARREST Use medication by any route, position, wound care, and other measures to relive pain and suffering. May use oxygen, suction and manual treatment of airway obstruction as needed for comfort.   Comments spoke with dter Elease Hashimoto on the phone      12/21/18 0952        Code Status History    Date Active Date Inactive Code Status Order ID Comments User Context   12/20/2018 2218 12/21/2018 0952 Full Code [id number]  Pearletha Alfred, NP ED   06/17/2016 0011 06/18/2016 1426 Full Code [id number]  Oralia Manis, MD Inpatient   02/06/2015 1510 02/07/2015 1631 Full Code [id number]  Adrian Saran, MD Inpatient   01/31/2015 1435 02/03/2015 1604 Full Code [id number]  Adrian Saran, MD ED    Advance Directive Documentation     Most Recent Value  Type of Advance Directive  Healthcare Power of Attorney  Pre-existing out of facility DNR order (yellow form or pink MOST form)  -  "MOST" Form in Place?  -      TOTAL TIME TAKING CARE OF THIS PATIENT: **40* minutes.    Enedina Finner M.D on 12/21/2018 at 9:59 AM  Between 7am to 6pm - Pager - [phone] After 6pm go to [email or web address] - Social research officer, government  [facility]  Office  [phone]  CC: Primary care physician; Corky Downs, MD

## 2018-12-21 NOTE — Progress Notes (Signed)
Patient was discharged home as per order, discharge instruction provided to family, patient left the unit via wheelchair for family to take her home .

## 2018-12-21 NOTE — Progress Notes (Signed)
PHARMACY NOTE:  ANTIMICROBIAL RENAL DOSAGE ADJUSTMENT  Current antimicrobial regimen includes a mismatch between antimicrobial dosage and estimated renal function.  As per policy approved by the Pharmacy & Therapeutics and Medical Executive Committees, the antimicrobial dosage will be adjusted accordingly.  Current antimicrobial dosage:  Amoxicillin-Clavulanate 875-125mg  per tablet  Renal Function: Estimated Creatinine Clearance: 25.1 mL/min (A) (by C-G formula based on SCr of 1.66 mg/dL (H)). []      On intermittent HD, scheduled: []      On CRRT    Antimicrobial dosage has been changed to:  Amoxicillin-Clavulanate 500-125 mg per tablet  Thank you for allowing pharmacy to be a part of this patient's care.  Stormy Card, [facility] 12/21/2018 9:57 AM

## 2018-12-25 LAB — CULTURE, BLOOD (ROUTINE X 2)
Culture: NO GROWTH
Culture: NO GROWTH
Special Requests: ADEQUATE
Special Requests: ADEQUATE

## 2019-01-15 DIAGNOSIS — J449 Chronic obstructive pulmonary disease, unspecified: Secondary | ICD-10-CM | POA: Diagnosis not present

## 2019-01-24 DEATH — deceased

## 2019-12-28 IMAGING — DX PORTABLE CHEST - 1 VIEW
1 series · 1 of 1 positions shown · non-contrast
Comparison: 08/06/2018 and 06/16/2016.

CLINICAL DATA: Difficulty breathing and shortness of breath.
History of hypertension and COPD.

EXAM:
PORTABLE CHEST 1 VIEW

[chest ap]
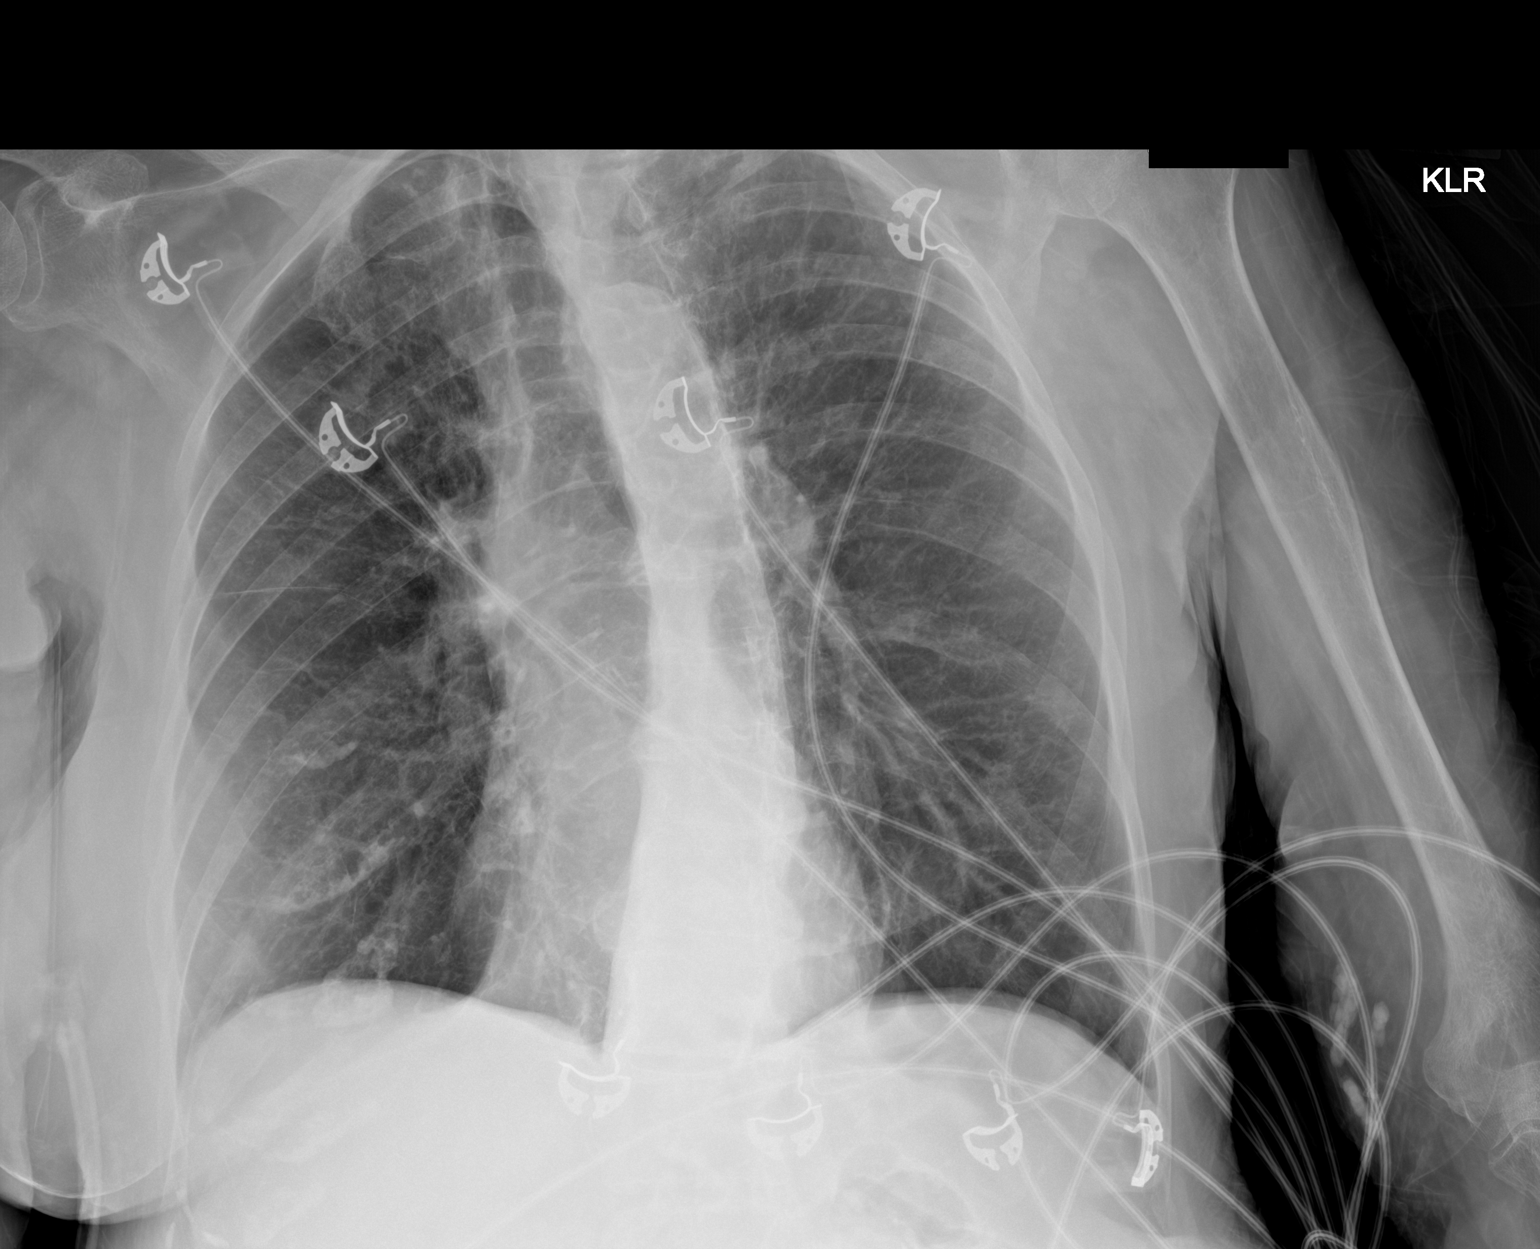

[1 of 1 positions shown; findings below may reference images not displayed]

FINDINGS: 0645 hours. Patient is rotated to the right. Allowing for this, the
heart size and mediastinal contours are stable. The lungs are
hyperinflated but clear. There is no pleural effusion or
pneumothorax. No acute osseous findings are evident. Telemetry leads
overlie the chest.
IMPRESSION: Stable findings of chronic obstructive pulmonary disease. No acute
cardiopulmonary process.
